# Patient Record
Sex: Male | Born: 1965 | Race: Black or African American | Hispanic: No | Marital: Married | Smoking: Current every day smoker
Health system: Southern US, Community
[De-identification: ages and names within clinical notes are randomized; demographics above are authoritative.]

## PROBLEM LIST (undated history)

## (undated) ENCOUNTER — Ambulatory Visit: Discharge status: Absent without leave | Payer: BC Managed Care – PPO

## (undated) DIAGNOSIS — K649 Unspecified hemorrhoids: Secondary | ICD-10-CM

## (undated) DIAGNOSIS — I10 Essential (primary) hypertension: Secondary | ICD-10-CM

## (undated) DIAGNOSIS — N529 Male erectile dysfunction, unspecified: Secondary | ICD-10-CM

## (undated) DIAGNOSIS — G4700 Insomnia, unspecified: Secondary | ICD-10-CM

## (undated) DIAGNOSIS — M1990 Unspecified osteoarthritis, unspecified site: Secondary | ICD-10-CM

## (undated) DIAGNOSIS — D126 Benign neoplasm of colon, unspecified: Secondary | ICD-10-CM

## (undated) DIAGNOSIS — K5790 Diverticulosis of intestine, part unspecified, without perforation or abscess without bleeding: Secondary | ICD-10-CM

## (undated) HISTORY — DX: Insomnia, unspecified: G47.00

## (undated) HISTORY — DX: Male erectile dysfunction, unspecified: N52.9

## (undated) HISTORY — DX: Unspecified osteoarthritis, unspecified site: M19.90

## (undated) HISTORY — DX: Diverticulosis of intestine, part unspecified, without perforation or abscess without bleeding: K57.90

## (undated) HISTORY — DX: Essential (primary) hypertension: I10

---

## 2012-01-02 LAB — HM DIABETES EYE EXAM: HM Diabetic Eye Exam: NEGATIVE

## 2012-03-19 ENCOUNTER — Encounter: Payer: Self-pay | Admitting: Internal Medicine

## 2012-03-19 ENCOUNTER — Encounter: Payer: Self-pay | Admitting: Gastroenterology

## 2012-03-19 ENCOUNTER — Ambulatory Visit (INDEPENDENT_AMBULATORY_CARE_PROVIDER_SITE_OTHER)
Admission: RE | Admit: 2012-03-19 | Discharge: 2012-03-19 | Disposition: A | Discharge status: Home / Self Care | Payer: BC Managed Care – PPO | Source: Ambulatory Visit | Attending: Internal Medicine | Admitting: Internal Medicine

## 2012-03-19 ENCOUNTER — Other Ambulatory Visit (INDEPENDENT_AMBULATORY_CARE_PROVIDER_SITE_OTHER): Payer: BC Managed Care – PPO

## 2012-03-19 ENCOUNTER — Ambulatory Visit (INDEPENDENT_AMBULATORY_CARE_PROVIDER_SITE_OTHER): Payer: BC Managed Care – PPO | Admitting: Internal Medicine

## 2012-03-19 VITALS — BP 130/72 | HR 73 | Temp 98.0°F | Resp 16 | Ht 71.0 in | Wt 195.0 lb

## 2012-03-19 DIAGNOSIS — K648 Other hemorrhoids: Secondary | ICD-10-CM | POA: Insufficient documentation

## 2012-03-19 DIAGNOSIS — M25519 Pain in unspecified shoulder: Secondary | ICD-10-CM

## 2012-03-19 DIAGNOSIS — M25511 Pain in right shoulder: Secondary | ICD-10-CM

## 2012-03-19 DIAGNOSIS — B351 Tinea unguium: Secondary | ICD-10-CM | POA: Insufficient documentation

## 2012-03-19 DIAGNOSIS — M19019 Primary osteoarthritis, unspecified shoulder: Secondary | ICD-10-CM | POA: Insufficient documentation

## 2012-03-19 DIAGNOSIS — Z0000 Encounter for general adult medical examination without abnormal findings: Secondary | ICD-10-CM

## 2012-03-19 DIAGNOSIS — N471 Phimosis: Secondary | ICD-10-CM | POA: Insufficient documentation

## 2012-03-19 DIAGNOSIS — N478 Other disorders of prepuce: Secondary | ICD-10-CM

## 2012-03-19 LAB — CK: Total CK: 612 U/L — ABNORMAL HIGH (ref 7–232)

## 2012-03-19 LAB — URINALYSIS, ROUTINE W REFLEX MICROSCOPIC
Bilirubin Urine: NEGATIVE
Hgb urine dipstick: NEGATIVE
Ketones, ur: NEGATIVE
Leukocytes, UA: NEGATIVE
Nitrite: NEGATIVE
Specific Gravity, Urine: 1.025 (ref 1.000–1.030)
Total Protein, Urine: NEGATIVE
Urine Glucose: NEGATIVE
Urobilinogen, UA: 0.2 (ref 0.0–1.0)
pH: 6 (ref 5.0–8.0)

## 2012-03-19 LAB — COMPREHENSIVE METABOLIC PANEL
Alkaline Phosphatase: 48 U/L (ref 39–117)
BUN: 20 mg/dL (ref 6–23)
CO2: 27 mEq/L (ref 19–32)
Creatinine, Ser: 1.3 mg/dL (ref 0.4–1.5)
GFR: 76.72 mL/min (ref >60.00)
Glucose, Bld: 108 mg/dL — ABNORMAL HIGH (ref 70–99)
Total Bilirubin: 0.7 mg/dL (ref 0.3–1.2)
Total Protein: 7.1 g/dL (ref 6.0–8.3)

## 2012-03-19 LAB — LIPID PANEL
HDL: 43.7 mg/dL (ref >39.00)
Triglycerides: 86 mg/dL (ref 0.0–149.0)
VLDL: 17.2 mg/dL (ref 0.0–40.0)

## 2012-03-19 LAB — PSA: PSA: 0.5 ng/mL (ref 0.10–4.00)

## 2012-03-19 LAB — TSH: TSH: 3.22 u[IU]/mL (ref 0.35–5.50)

## 2012-03-19 LAB — FECAL OCCULT BLOOD, GUAIAC: Fecal Occult Blood: NEGATIVE

## 2012-03-19 MED ORDER — TERBINAFINE HCL 250 MG PO TABS: 250.0000 mg | ORAL_TABLET | Freq: Every day | ORAL | Status: DC

## 2012-03-19 NOTE — Assessment & Plan Note (Signed)
Check his LFT's Start terbinafine if LFT's are normal

## 2012-03-19 NOTE — Patient Instructions (Signed)
Ringworm, Nail A fungal infection of the nail (tinea unguium/onychomycosis) is common. It is common as the visible part of the nail is composed of dead cells which have no blood supply to help prevent infection. It occurs because fungi are everywhere and will pick any opportunity to grow on any dead material. Because nails are very slow growing they require up to 2 years of treatment with anti-fungal medications. The entire nail back to the base is infected. This includes approximately  of the nail which you cannot see. If your caregiver has prescribed a medication by mouth, take it every day and as directed. No progress will be seen for at least 6 to 9 months. Do not be disappointed! Because fungi live on dead cells with little or no exposure to blood supply, medication delivery to the infection is slow; thus the cure is slow. It is also why you can observe no progress in the first 6 months. The nail becoming cured is the base of the nail, as it has the blood supply. Topical medication such as creams and ointments are usually not effective. Important in successful treatment of nail fungus is closely following the medication regimen that your doctor prescribes. Sometimes you and your caregiver may elect to speed up this process by surgical removal of all the nails. Even this may still require 6 to 9 months of additional oral medications. See your caregiver as directed. Remember there will be no visible improvement for at least 6 months. See your caregiver sooner if other signs of infection (redness and swelling) develop. Document Released: 03/04/2000 Document Revised: 05/30/2011 Document Reviewed: 05/13/2008 [facility] Patient Information 2013 [place], [place]. Health Maintenance, Males A healthy lifestyle and preventative care can promote health and wellness.  Maintain regular health, dental, and eye exams.  Eat a healthy diet. Foods like vegetables, fruits, whole grains, low-fat dairy products, and lean  protein foods contain the nutrients you need without too many calories. Decrease your intake of foods high in solid fats, added sugars, and salt. Get information about a proper diet from your caregiver, if necessary.  Regular physical exercise is one of the most important things you can do for your health. Most adults should get at least 150 minutes of moderate-intensity exercise (any activity that increases your heart rate and causes you to sweat) each week. In addition, most adults need muscle-strengthening exercises on 2 or more days a week.   Maintain a healthy weight. The body mass index (BMI) is a screening tool to identify possible weight problems. It provides an estimate of body fat based on height and weight. Your caregiver can help determine your BMI, and can help you achieve or maintain a healthy weight. For adults 20 years and older:  A BMI below 18.5 is considered underweight.  A BMI of 18.5 to 24.9 is normal.  A BMI of 25 to 29.9 is considered overweight.  A BMI of 30 and above is considered obese.  Maintain normal blood lipids and cholesterol by exercising and minimizing your intake of saturated fat. Eat a balanced diet with plenty of fruits and vegetables. Blood tests for lipids and cholesterol should begin at age 58 and be repeated every 5 years. If your lipid or cholesterol levels are high, you are over 50, or you are a high risk for heart disease, you may need your cholesterol levels checked more frequently.Ongoing high lipid and cholesterol levels should be treated with medicines, if diet and exercise are not effective.  If you smoke, find  out from your caregiver how to quit. If you do not use tobacco, do not start.  If you choose to drink alcohol, do not exceed 2 drinks per day. One drink is considered to be 12 ounces (355 mL) of beer, 5 ounces (148 mL) of wine, or 1.5 ounces (44 mL) of liquor.  Avoid use of street drugs. Do not share needles with anyone. Ask for help if  you need support or instructions about stopping the use of drugs.  High blood pressure causes heart disease and increases the risk of stroke. Blood pressure should be checked at least every 1 to 2 years. Ongoing high blood pressure should be treated with medicines if weight loss and exercise are not effective.  If you are 35 to 47 years old, ask your caregiver if you should take aspirin to prevent heart disease.  Diabetes screening involves taking a blood sample to check your fasting blood sugar level. This should be done once every 3 years, after age 107, if you are within normal weight and without risk factors for diabetes. Testing should be considered at a younger age or be carried out more frequently if you are overweight and have at least 1 risk factor for diabetes.  Colorectal cancer can be detected and often prevented. Most routine colorectal cancer screening begins at the age of 53 and continues through age 40. However, your caregiver may recommend screening at an earlier age if you have risk factors for colon cancer. On a yearly basis, your caregiver may provide home test kits to check for hidden blood in the stool. Use of a small camera at the end of a tube, to directly examine the colon (sigmoidoscopy or colonoscopy), can detect the earliest forms of colorectal cancer. Talk to your caregiver about this at age 32, when routine screening begins. Direct examination of the colon should be repeated every 5 to 10 years through age 2, unless early forms of pre-cancerous polyps or small growths are found.  Hepatitis C blood testing is recommended for all people born from [birth year] through 1966 and any individual with known risks for hepatitis C.  Healthy men should no longer receive prostate-specific antigen (PSA) blood tests as part of routine cancer screening. Consult with your caregiver about prostate cancer screening.  Testicular cancer screening is not recommended for adolescents or adult males who  have no symptoms. Screening includes self-exam, caregiver exam, and other screening tests. Consult with your caregiver about any symptoms you have or any concerns you have about testicular cancer.  Practice safe sex. Use condoms and avoid high-risk sexual practices to reduce the spread of sexually transmitted infections (STIs).  Use sunscreen with a sun protection factor (SPF) of 30 or greater. Apply sunscreen liberally and repeatedly throughout the day. You should seek shade when your shadow is shorter than you. Protect yourself by wearing long sleeves, pants, a wide-brimmed hat, and sunglasses year round, whenever you are outdoors.  Notify your caregiver of new moles or changes in moles, especially if there is a change in shape or color. Also notify your caregiver if a mole is larger than the size of a pencil eraser.  A one-time screening for abdominal aortic aneurysm (AAA) and surgical repair of large AAAs by sound wave imaging (ultrasonography) is recommended for ages 31 to 9 years who are current or former smokers.  Stay current with your immunizations. Document Released: 09/03/2007 Document Revised: 05/30/2011 Document Reviewed: 08/02/2010 [facility] Patient Information 2013 [place], [place].

## 2012-03-19 NOTE — Assessment & Plan Note (Signed)
He wants to have a circumcision done ---> urology referral

## 2012-03-19 NOTE — Assessment & Plan Note (Signed)
GI referral - he wants these treated

## 2012-03-19 NOTE — Assessment & Plan Note (Signed)
Exam done Vaccines were reviewed Labs ordered Pt ed material was given 

## 2012-03-19 NOTE — Progress Notes (Signed)
Subjective:    Patient ID: Dakota Jackson, male    DOB: 14-Jun-1965, 46 y.o.   MRN: [id number]  Arthritis Presents for initial visit. The disease course has been fluctuating. The condition has lasted for 4 months. He complains of pain. He reports no stiffness, joint swelling or joint warmth. Affected locations include the left shoulder and right shoulder. His pain is at a severity of 2/10. Associated symptoms include pain at night and pain while resting. Pertinent negatives include no diarrhea, dry eyes, dry mouth, dysuria, fatigue, fever, rash, Raynaud's syndrome, uveitis or weight loss. His pertinent risk factors include overuse. Past treatments include NSAIDs. The treatment provided moderate relief. Factors aggravating his arthritis include activity. Compliance with prior treatments has been variable.      Review of Systems  Constitutional: Negative.  Negative for fever, weight loss and fatigue.  HENT: Negative.   Eyes: Negative.   Respiratory: Negative.   Cardiovascular: Negative for chest pain, palpitations and leg swelling.  Gastrointestinal: Positive for anal bleeding and rectal pain. Negative for nausea, vomiting, abdominal pain, diarrhea, constipation, blood in stool and abdominal distention.  Genitourinary: Positive for penile pain (foreskin is uncomfortable). Negative for dysuria, urgency, frequency, hematuria, flank pain, decreased urine volume, discharge, penile swelling, scrotal swelling, enuresis, difficulty urinating, genital sores and testicular pain.  Musculoskeletal: Positive for myalgias (around both shoulders), arthralgias (shoulders) and arthritis. Negative for back pain, joint swelling, gait problem and stiffness.  Skin: Positive for color change (toenails are thick and dark). Negative for pallor, rash and wound.  Hematological: Negative for adenopathy. Does not bruise/bleed easily.  Psychiatric/Behavioral: Negative.        Objective:   Physical Exam  Vitals  reviewed. Constitutional: He is oriented to person, place, and time. He appears well-developed and well-nourished. No distress.  HENT:  Head: Normocephalic and atraumatic.  Mouth/Throat: Oropharynx is clear and moist. No oropharyngeal exudate.  Eyes: Conjunctivae normal are normal. Right eye exhibits no discharge. Left eye exhibits no discharge. No scleral icterus.  Neck: Normal range of motion. Neck supple. No JVD present. No tracheal deviation present. No thyromegaly present.  Cardiovascular: Normal rate, regular rhythm, normal heart sounds and intact distal pulses.  Exam reveals no gallop and no friction rub.   No murmur heard. Pulmonary/Chest: Effort normal and breath sounds normal. No stridor. No respiratory distress. He has no wheezes. He has no rales. He exhibits no tenderness.  Abdominal: Soft. Bowel sounds are normal. He exhibits no distension and no mass. There is no tenderness. There is no rebound and no guarding. Hernia confirmed negative in the right inguinal area and confirmed negative in the left inguinal area.  Genitourinary: Prostate normal, testes normal and penis normal. Rectal exam shows external hemorrhoid and internal hemorrhoid. Rectal exam shows no fissure, no mass, no tenderness and anal tone normal. Guaiac negative stool. Prostate is not enlarged and not tender. Right testis shows no mass, no swelling and no tenderness. Right testis is descended. Left testis shows no mass, no swelling and no tenderness. Left testis is descended. Uncircumcised. No phimosis, paraphimosis, hypospadias, penile erythema or penile tenderness. No discharge found.  Musculoskeletal: Normal range of motion. He exhibits no edema and no tenderness.       Right shoulder: Normal. He exhibits normal range of motion, no tenderness, no bony tenderness, no swelling, no effusion, no crepitus, no deformity, no laceration, no pain, no spasm, normal pulse and normal strength.       Left shoulder: Normal. He  exhibits normal range  of motion, no tenderness, no bony tenderness, no swelling, no effusion, no crepitus, no deformity, no laceration, no pain, no spasm, normal pulse and normal strength.  Lymphadenopathy:    He has no cervical adenopathy.       Right: No inguinal adenopathy present.       Left: No inguinal adenopathy present.  Neurological: He is alert and oriented to person, place, and time. He displays normal reflexes. No cranial nerve deficit. He exhibits normal muscle tone. Coordination normal.  Skin: Skin is warm and dry. No rash noted. He is not diaphoretic. No erythema. No pallor.       50% of toenails show dark thickening with lysis and subungual debris  Psychiatric: He has a normal mood and affect. His behavior is normal. Judgment and thought content normal.      No results found for this basename: WBC, HGB, HCT, PLT, GLUCOSE, CHOL, TRIG, HDL, LDLDIRECT, LDLCALC, ALT, AST, NA, K, CL, CREATININE, BUN, CO2, TSH, PSA, INR, GLUF, HGBA1C, MICROALBUR      Assessment & Plan:

## 2012-03-19 NOTE — Assessment & Plan Note (Signed)
He will continue taking nsaids for pain I will check plain films of his shoulders today to look for djd,spurs I will check his CPK level to see if he has a myopathy

## 2012-03-20 ENCOUNTER — Encounter: Payer: Self-pay | Admitting: Internal Medicine

## 2012-03-20 LAB — CBC WITH DIFFERENTIAL/PLATELET
Basophils Relative: 1.5 % (ref 0.0–3.0)
Eosinophils Relative: 3.1 % (ref 0.0–5.0)
HCT: 44.2 % (ref 39.0–52.0)
Hemoglobin: 15.1 g/dL (ref 13.0–17.0)
Lymphs Abs: 2.1 10*3/uL (ref 0.7–4.0)
Monocytes Relative: 12.3 % — ABNORMAL HIGH (ref 3.0–12.0)
Platelets: 274 10*3/uL (ref 150.0–400.0)
RBC: 5.1 Mil/uL (ref 4.22–5.81)
WBC: 5.1 10*3/uL (ref 4.5–10.5)

## 2012-03-21 DIAGNOSIS — D126 Benign neoplasm of colon, unspecified: Secondary | ICD-10-CM

## 2012-03-21 HISTORY — DX: Benign neoplasm of colon, unspecified: D12.6

## 2012-03-23 ENCOUNTER — Telehealth: Payer: Self-pay | Admitting: Internal Medicine

## 2012-03-23 NOTE — Telephone Encounter (Signed)
Patient is calling to get the results from his x-ray

## 2012-03-23 NOTE — Telephone Encounter (Signed)
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

## 2012-03-28 NOTE — Telephone Encounter (Signed)
Called patient// no answer and letter mailed out again

## 2012-04-02 ENCOUNTER — Ambulatory Visit: Payer: BC Managed Care – PPO | Admitting: Gastroenterology

## 2012-04-09 ENCOUNTER — Encounter: Payer: Self-pay | Admitting: Gastroenterology

## 2012-04-09 ENCOUNTER — Ambulatory Visit (INDEPENDENT_AMBULATORY_CARE_PROVIDER_SITE_OTHER): Payer: BC Managed Care – PPO | Admitting: Gastroenterology

## 2012-04-09 VITALS — BP 102/52 | HR 76 | Ht 69.75 in | Wt 198.5 lb

## 2012-04-09 DIAGNOSIS — K921 Melena: Secondary | ICD-10-CM

## 2012-04-09 DIAGNOSIS — K5900 Constipation, unspecified: Secondary | ICD-10-CM

## 2012-04-09 MED ORDER — PEG-KCL-NACL-NASULF-NA ASC-C 100 G PO SOLR: 1.0000 | Freq: Once | ORAL | Status: DC

## 2012-04-09 NOTE — Patient Instructions (Addendum)
You have been scheduled for a colonoscopy with propofol. Please follow written instructions given to you at your visit today.  Please pick up your prep kit at the pharmacy within the next 1-3 days. If you use inhalers (even only as needed) or a CPAP machine, please bring them with you on the day of your procedure.  High-Fiber Diet Fiber is found in fruits, vegetables, and grains. A high-fiber diet encourages the addition of more whole grains, legumes, fruits, and vegetables in your diet. The recommended amount of fiber for adult males is 38 g per day. For adult females, it is 25 g per day. Pregnant and lactating women should get 28 g of fiber per day. If you have a digestive or bowel problem, ask your caregiver for advice before adding high-fiber foods to your diet. Eat a variety of high-fiber foods instead of only a select few type of foods.  PURPOSE  To increase stool bulk.  To make bowel movements more regular to prevent constipation.  To lower cholesterol.  To prevent overeating. WHEN IS THIS DIET USED?  It may be used if you have constipation and hemorrhoids.  It may be used if you have uncomplicated diverticulosis (intestine condition) and irritable bowel syndrome.  It may be used if you need help with weight management.  It may be used if you want to add it to your diet as a protective measure against atherosclerosis, diabetes, and cancer. SOURCES OF FIBER  Whole-grain breads and cereals.  Fruits, such as apples, oranges, bananas, berries, prunes, and pears.  Vegetables, such as green peas, carrots, sweet potatoes, beets, broccoli, cabbage, spinach, and artichokes.  Legumes, such split peas, soy, lentils.  Almonds. FIBER CONTENT IN FOODS Starches and Grains / Dietary Fiber (g)  Cheerios, 1 cup / 3 g  Corn Flakes cereal, 1 cup / 0.7 g  Rice crispy treat cereal, 1 cup / 0.3 g  Instant oatmeal (cooked),  cup / 2 g  Frosted wheat cereal, 1 cup / 5.1 g  Brown,  long-grain rice (cooked), 1 cup / 3.5 g  White, long-grain rice (cooked), 1 cup / 0.6 g  Enriched macaroni (cooked), 1 cup / 2.5 g Legumes / Dietary Fiber (g)  Baked beans (canned, plain, or vegetarian),  cup / 5.2 g  Kidney beans (canned),  cup / 6.8 g  Pinto beans (cooked),  cup / 5.5 g Breads and Crackers / Dietary Fiber (g)  Plain or honey graham crackers, 2 squares / 0.7 g  Saltine crackers, 3 squares / 0.3 g  Plain, salted pretzels, 10 pieces / 1.8 g  Whole-wheat bread, 1 slice / 1.9 g  White bread, 1 slice / 0.7 g  Raisin bread, 1 slice / 1.2 g  Plain bagel, 3 oz / 2 g  Flour tortilla, 1 oz / 0.9 g  Corn tortilla, 1 small / 1.5 g  Hamburger or hotdog bun, 1 small / 0.9 g Fruits / Dietary Fiber (g)  Apple with skin, 1 medium / 4.4 g  Sweetened applesauce,  cup / 1.5 g  Banana,  medium / 1.5 g  Grapes, 10 grapes / 0.4 g  Orange, 1 small / 2.3 g  Raisin, 1.5 oz / 1.6 g  Melon, 1 cup / 1.4 g Vegetables / Dietary Fiber (g)  Green beans (canned),  cup / 1.3 g  Carrots (cooked),  cup / 2.3 g  Broccoli (cooked),  cup / 2.8 g  Peas (cooked),  cup / 4.4 g  Mashed  potatoes,  cup / 1.6 g  Lettuce, 1 cup / 0.5 g  Corn (canned),  cup / 1.6 g  Tomato,  cup / 1.1 g Document Released: 03/07/2005 Document Revised: 09/06/2011 Document Reviewed: 06/09/2011 [facility] Patient Information 2013 [place], [place].   Constipation, Adult Constipation is when a person has fewer than 3 bowel movements a week; has difficulty having a bowel movement; or has stools that are dry, hard, or larger than normal. As people grow older, constipation is more common. If you try to fix constipation with medicines that make you have a bowel movement (laxatives), the problem may get worse. Long-term laxative use may cause the muscles of the colon to become weak. A low-fiber diet, not taking in enough fluids, and taking certain medicines may make constipation worse. CAUSES     Certain medicines, such as antidepressants, pain medicine, iron supplements, antacids, and water pills.   Certain diseases, such as diabetes, irritable bowel syndrome (IBS), thyroid disease, or depression.   Not drinking enough water.   Not eating enough fiber-rich foods.   Stress or travel.  Lack of physical activity or exercise.  Not going to the restroom when there is the urge to have a bowel movement.  Ignoring the urge to have a bowel movement.  Using laxatives too much. SYMPTOMS   Having fewer than 3 bowel movements a week.   Straining to have a bowel movement.   Having hard, dry, or larger than normal stools.   Feeling full or bloated.   Pain in the lower abdomen.  Not feeling relief after having a bowel movement. DIAGNOSIS  Your caregiver will take a medical history and perform a physical exam. Further testing may be done for severe constipation. Some tests may include:   A barium enema X-ray to examine your rectum, colon, and sometimes, your small intestine.  A sigmoidoscopy to examine your lower colon.  A colonoscopy to examine your entire colon. TREATMENT  Treatment will depend on the severity of your constipation and what is causing it. Some dietary treatments include drinking more fluids and eating more fiber-rich foods. Lifestyle treatments may include regular exercise. If these diet and lifestyle recommendations do not help, your caregiver may recommend taking over-the-counter laxative medicines to help you have bowel movements. Prescription medicines may be prescribed if over-the-counter medicines do not work.  HOME CARE INSTRUCTIONS   Increase dietary fiber in your diet, such as fruits, vegetables, whole grains, and beans. Limit high-fat and processed sugars in your diet, such as [place] fries, hamburgers, cookies, candies, and soda.   A fiber supplement may be added to your diet if you cannot get enough fiber from foods.   Drink enough  fluids to keep your urine clear or pale yellow.   Exercise regularly or as directed by your caregiver.   Go to the restroom when you have the urge to go. Do not hold it.  Only take medicines as directed by your caregiver. Do not take other medicines for constipation without talking to your caregiver first. SEEK IMMEDIATE MEDICAL CARE IF:   You have bright red blood in your stool.   Your constipation lasts for more than 4 days or gets worse.   You have abdominal or rectal pain.   You have thin, pencil-like stools.  You have unexplained weight loss. MAKE SURE YOU:   Understand these instructions.  Will watch your condition.  Will get help right away if you are not doing well or get worse. Document Released: 12/04/2003 Document  Revised: 05/30/2011 Document Reviewed: 02/08/2011 [facility] Patient Information 2013 [place], [place].

## 2012-04-09 NOTE — Progress Notes (Signed)
History of Present Illness: This is a 47 year old male with intermittent, small volume bright red blood per rectum and mild rectal pain generally associated with bowel movements. He relates that his symptoms have been present for 8 or 9 years. He states that while he was incarcerated he was sent to [facility] for colonoscopy in 2007 or  2008 that showed only diverticulosis. He's been using Preparation H suppositories as needed. He complains of mild intermittent constipation. Denies weight loss, abdominal pain, diarrhea, change in stool caliber, melena, nausea, vomiting, dysphagia, reflux symptoms, chest pain.  Review of Systems: Pertinent positive and negative review of systems were noted in the above HPI section. All other review of systems were otherwise negative.  Current Medications, Allergies, Past Medical History, Past Surgical History, Family History and Social History were reviewed in [employer] record.  Physical Exam: General: Well developed , well nourished, no acute distress Head: Normocephalic and atraumatic Eyes:  sclerae anicteric, EOMI Ears: Normal auditory acuity Mouth: No deformity or lesions Neck: Supple, no masses or thyromegaly Lungs: Clear throughout to auscultation Heart: Regular rate and rhythm; no murmurs, rubs or bruits Abdomen: Soft, non tender and non distended. No masses, hepatosplenomegaly or hernias noted. Normal Bowel sounds Rectal: recent exam by Dr. Yetta Barre was negative, deferred to colonosocpy Musculoskeletal: Symmetrical with no gross deformities  Skin: No lesions on visible extremities Pulses:  Normal pulses noted Extremities: No clubbing, cyanosis, edema or deformities noted Neurological: Alert oriented x 4, grossly nonfocal Cervical Nodes:  No significant cervical adenopathy Inguinal Nodes: No significant inguinal adenopathy Psychological:  Alert and cooperative. Normal mood and affect  Assessment and Recommendations:  1.  Hematochezia, rectal pain, constipation. Prior colonoscopy in 2007 or 2008 showed diverticulosis by the patient's report. Rule out hemorrhoids, fissures, proctitis and other disorders. Schedule colonoscopy. Increase dietary fiber and water intake. The risks, benefits, and alternatives to colonoscopy with possible biopsy and possible polypectomy were discussed with the patient and they consent to proceed.

## 2012-04-10 ENCOUNTER — Encounter: Payer: Self-pay | Admitting: Gastroenterology

## 2012-04-10 ENCOUNTER — Encounter: Payer: Self-pay | Admitting: *Deleted

## 2012-04-10 ENCOUNTER — Ambulatory Visit (AMBULATORY_SURGERY_CENTER): Payer: BC Managed Care – PPO | Admitting: Gastroenterology

## 2012-04-10 VITALS — BP 117/61 | HR 65 | Temp 97.5°F | Resp 15 | Ht 71.0 in | Wt 195.0 lb

## 2012-04-10 DIAGNOSIS — K5900 Constipation, unspecified: Secondary | ICD-10-CM

## 2012-04-10 DIAGNOSIS — K921 Melena: Secondary | ICD-10-CM

## 2012-04-10 DIAGNOSIS — D126 Benign neoplasm of colon, unspecified: Secondary | ICD-10-CM

## 2012-04-10 DIAGNOSIS — K648 Other hemorrhoids: Secondary | ICD-10-CM

## 2012-04-10 MED ORDER — SODIUM CHLORIDE 0.9 % IV SOLN: 500.0000 mL | INTRAVENOUS | Status: DC

## 2012-04-10 NOTE — Op Note (Signed)
[facility] [address] [place], [address]   COLONOSCOPY PROCEDURE REPORT  PATIENT: Dakota Jackson, Dakota Jackson.  MR#: [id number] BIRTHDATE: 1965/06/28 , 46  yrs. old GENDER: Male ENDOSCOPIST: Meryl Dare, MD, [facility] REFERRED IO:NGEXBM Karsten Ro, M.D. PROCEDURE DATE:  04/10/2012 PROCEDURE:   Colonoscopy with biopsy and injection sclerosis of internal hemorrhoids ASA CLASS:   Class II INDICATIONS:hematochezia and constipation. MEDICATIONS: MAC sedation, administered by CRNA and propofol (Diprivan) 350mg  IV DESCRIPTION OF PROCEDURE:   After the risks benefits and alternatives of the procedure were thoroughly explained, informed consent was obtained.  A digital rectal exam revealed no abnormalities of the rectum.   The LB [id number]  endoscope was introduced through the anus and advanced to the cecum, which was identified by both the appendix and ileocecal valve. No adverse events experienced.   The quality of the prep was good, using MoviPrep  The instrument was then slowly withdrawn as the colon was fully examined.  COLON FINDINGS: Two sessile polyps measuring 3-4 mm in size were found at the cecum.  A polypectomy was performed with cold forceps. The resection was complete and the polyp tissue was completely retrieved.   A sessile polyp measuring 5 mm in size was found in the rectum.  A polypectomy was performed with cold forceps.  The resection was complete and the polyp tissue was completely retrieved.   The colon was otherwise normal.  There was no diverticulosis, inflammation, polyps or cancers unless previously stated.  Retroflexed views revealed moderate internal hemorrhoids. 2.5 cc of 23.4% saline injected into the hemorrhoids well above the dentate line. The time to cecum=1 minutes 30 seconds.  Withdrawal time=11 minutes 12 seconds.  The scope was withdrawn and the procedure completed. COMPLICATIONS: There were no complications.  ENDOSCOPIC  IMPRESSION: 1.   Two sessile polyps measuring 3-4 mm at the cecum; polypectomy performed with cold forceps 2.   Sessile polyp measuring 5 mm in the rectum; polypectomy performed with cold forceps 3.   Moderate internal hemorrhoids  RECOMMENDATIONS: 1.  Await pathology results 2.  Repeat colonoscopy in 5 years if polyp adenomatous; otherwise 10 years 3.  High fiber diet with liberal fluid intake.  eSigned:  Meryl Dare, MD, [facility] 04/10/2012 3:57 PM

## 2012-04-10 NOTE — Patient Instructions (Addendum)
Discharge instructions given with verbal understanding. Handout on polyps given. Resume previous medications. YOU HAD AN ENDOSCOPIC PROCEDURE TODAY AT THE [facility]: Refer to the procedure report that was given to you for any specific questions about what was found during the examination.  If the procedure report does not answer your questions, please call your gastroenterologist to clarify.  If you requested that your care partner not be given the details of your procedure findings, then the procedure report has been included in a sealed envelope for you to review at your convenience later.  YOU SHOULD EXPECT: Some feelings of bloating in the abdomen. Passage of more gas than usual.  Walking can help get rid of the air that was put into your GI tract during the procedure and reduce the bloating. If you had a lower endoscopy (such as a colonoscopy or flexible sigmoidoscopy) you may notice spotting of blood in your stool or on the toilet paper. If you underwent a bowel prep for your procedure, then you may not have a normal bowel movement for a few days.  DIET: Your first meal following the procedure should be a light meal and then it is ok to progress to your normal diet.  A half-sandwich or bowl of soup is an example of a good first meal.  Heavy or fried foods are harder to digest and may make you feel nauseous or bloated.  Likewise meals heavy in dairy and vegetables can cause extra gas to form and this can also increase the bloating.  Drink plenty of fluids but you should avoid alcoholic beverages for 24 hours.  ACTIVITY: Your care partner should take you home directly after the procedure.  You should plan to take it easy, moving slowly for the rest of the day.  You can resume normal activity the day after the procedure however you should NOT DRIVE or use heavy machinery for 24 hours (because of the sedation medicines used during the test).    SYMPTOMS TO REPORT IMMEDIATELY: A  gastroenterologist can be reached at any hour.  During normal business hours, 8:30 AM to 5:00 PM Monday through Friday, call [phone].  After hours and on weekends, please call the GI answering service at [phone] who will take a message and have the physician on call contact you.   Following lower endoscopy (colonoscopy or flexible sigmoidoscopy):  Excessive amounts of blood in the stool  Significant tenderness or worsening of abdominal pains  Swelling of the abdomen that is new, acute  Fever of 100F or higher  FOLLOW UP: If any biopsies were taken you will be contacted by phone or by letter within the next 1-3 weeks.  Call your gastroenterologist if you have not heard about the biopsies in 3 weeks.  Our staff will call the home number listed on your records the next business day following your procedure to check on you and address any questions or concerns that you may have at that time regarding the information given to you following your procedure. This is a courtesy call and so if there is no answer at the home number and we have not heard from you through the emergency physician on call, we will assume that you have returned to your regular daily activities without incident.  SIGNATURES/CONFIDENTIALITY: You and/or your care partner have signed paperwork which will be entered into your electronic medical record.  These signatures attest to the fact that that the information above on your After Visit Summary has   been reviewed and is understood.  Full responsibility of the confidentiality of this discharge information lies with you and/or your care-partner. 

## 2012-04-10 NOTE — Progress Notes (Signed)
Lidocaine-40mg IV prior to Propofol InductionPropofol given over incremental dosages 

## 2012-04-10 NOTE — Progress Notes (Signed)
Called to room to assist during endoscopic procedure.  Patient ID and intended procedure confirmed with present staff. Received instructions for my participation in the procedure from the performing physician.  

## 2012-04-10 NOTE — Progress Notes (Signed)
Patient did not experience any of the following events: a burn prior to discharge; a fall within the facility; wrong site/side/patient/procedure/implant event; or a hospital transfer or hospital admission upon discharge from the facility. (G8907) Patient did not have preoperative order for IV antibiotic SSI prophylaxis. (G8918)  

## 2012-04-11 ENCOUNTER — Telehealth: Payer: Self-pay

## 2012-04-11 NOTE — Telephone Encounter (Signed)
  Follow up Call-  Call back number 04/10/2012  Post procedure Call Back phone  # [phone]  Permission to leave phone message Yes     Patient questions:  Do you have a fever, pain , or abdominal swelling? no Pain Score  0 *  Have you tolerated food without any problems? yes  Have you been able to return to your normal activities? yes  Do you have any questions about your discharge instructions: Diet   no Medications  no Follow up visit  no  Do you have questions or concerns about your Care? no  Actions: * If pain score is 4 or above: No action needed, pain <4.

## 2012-04-13 ENCOUNTER — Other Ambulatory Visit: Payer: Self-pay | Admitting: Urology

## 2012-04-16 ENCOUNTER — Ambulatory Visit: Payer: BC Managed Care – PPO | Admitting: Internal Medicine

## 2012-04-16 ENCOUNTER — Encounter: Payer: Self-pay | Admitting: Gastroenterology

## 2012-04-16 ENCOUNTER — Encounter (HOSPITAL_BASED_OUTPATIENT_CLINIC_OR_DEPARTMENT_OTHER): Payer: Self-pay | Admitting: *Deleted

## 2012-04-16 DIAGNOSIS — Z0289 Encounter for other administrative examinations: Secondary | ICD-10-CM

## 2012-04-16 NOTE — Progress Notes (Signed)
Pt instructed npo p mn tonight.  To [facility] 1/28 @ 0645. Needs hgb , ?ekg.

## 2012-04-16 NOTE — H&P (Signed)
History of Present Illness     Dakota Jackson is referred by Dr Sanda Linger for circumcision consultation.  He has been having breakdowns of his foreskin for several years.  It is difficult to retract the foreskin and hygiene is an issue.  He would like to have a circumcision.  He has difficulty with erections.  He has used Viagra in the past with excellent results.  He would like to try Cialis.   Past Medical History Problems  1. History of  Hypertension 401.9  Current Meds 1. LamISIL 250 MG Oral Tablet; Therapy: (Recorded:22Jan2014) to  Allergies Medication  1. No Known Drug Allergies  Family History Problems  1. Maternal history of  Family Health Status Number Of Children 2. Family history of  Lung Cancer V16.1 3. Family history of  Mother Deceased At Age 6  Social History Problems    Marital History - Currently Married   Occupation:   Tobacco Use 305.1 Denied    History of  Alcohol Use   History of  Caffeine Use  Review of Systems Genitourinary, constitutional, skin, eye, otolaryngeal, hematologic/lymphatic, cardiovascular, pulmonary, endocrine, musculoskeletal, gastrointestinal, neurological and psychiatric system(s) were reviewed and pertinent findings if present are noted.  Musculoskeletal: joint pain.    Vitals Vital Signs [Data Includes: Last 1 Day]  22Jan2014 11:47AM  BMI Calculated: 28.06 BSA Calculated: 2.07 Height: 5 ft 10 in Weight: 196 lb  Blood Pressure: 129 / 81 Heart Rate: 70 Respiration: 18  Physical Exam Constitutional: Well nourished and well developed . No acute distress.  ENT:. The ears and nose are normal in appearance.  Neck: The appearance of the neck is normal and no neck mass is present.  Pulmonary: No respiratory distress and normal respiratory rhythm and effort.  Cardiovascular: Heart rate and rhythm are normal . No peripheral edema.  Abdomen: The abdomen is soft and nontender. No masses are palpated. No CVA tenderness. No hernias are  palpable. No hepatosplenomegaly noted.  Rectal: The prostate exam was refused by the patient.  Genitourinary: Examination of the penis demonstrates no discharge, no masses, no lesions and a normal meatus. The scrotum is without lesions. The right epididymis is palpably normal and non-tender. The left epididymis is palpably normal and non-tender. The right testis is non-tender and without masses. The left testis is non-tender and without masses.  Lymphatics: The femoral and inguinal nodes are not enlarged or tender.  Skin: Normal skin turgor, no visible rash and no visible skin lesions.  Neuro/Psych:. Mood and affect are appropriate.    Results/Data Urine [Data Includes: Last 1 Day]   22Jan2014  COLOR YELLOW   APPEARANCE CLEAR   SPECIFIC GRAVITY 1.015   pH 6.0   GLUCOSE NEG mg/dL  BILIRUBIN NEG   KETONE NEG mg/dL  BLOOD NEG   PROTEIN NEG mg/dL  UROBILINOGEN 0.2 mg/dL  NITRITE NEG   LEUKOCYTE ESTERASE NEG    Assessment Assessed  1. Phimosis 605 2. 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 UA With REFLEX  Done: 22Jan2014 11:16AM   Circumcision.  The procedure, risks, benefits were reviewed with the patient.  The risks include but are not limited to hemorrhage, infection, hematoma, skin loss.  He understands and wishes to proceed.  Cialis 20 mgm PRN for erectile dysfunction.   Signatures  CC: Dr Sanda Linger  Electronically signed by : Su Grand, M.D.; Apr 11 2012 12:19PM

## 2012-04-17 ENCOUNTER — Encounter (HOSPITAL_BASED_OUTPATIENT_CLINIC_OR_DEPARTMENT_OTHER): Payer: Self-pay | Admitting: *Deleted

## 2012-04-17 ENCOUNTER — Encounter (HOSPITAL_BASED_OUTPATIENT_CLINIC_OR_DEPARTMENT_OTHER): Payer: Self-pay | Admitting: Anesthesiology

## 2012-04-17 ENCOUNTER — Ambulatory Visit (HOSPITAL_BASED_OUTPATIENT_CLINIC_OR_DEPARTMENT_OTHER): Payer: BC Managed Care – PPO | Admitting: Anesthesiology

## 2012-04-17 ENCOUNTER — Encounter (HOSPITAL_BASED_OUTPATIENT_CLINIC_OR_DEPARTMENT_OTHER)
Admission: RE | Disposition: A | Discharge status: Home / Self Care | Payer: Self-pay | Source: Ambulatory Visit | Attending: Urology

## 2012-04-17 ENCOUNTER — Ambulatory Visit (HOSPITAL_BASED_OUTPATIENT_CLINIC_OR_DEPARTMENT_OTHER)
Admission: RE | Admit: 2012-04-17 | Discharge: 2012-04-17 | Disposition: A | Discharge status: Home / Self Care | Payer: BC Managed Care – PPO | Source: Ambulatory Visit | Attending: Urology | Admitting: Urology

## 2012-04-17 DIAGNOSIS — I10 Essential (primary) hypertension: Secondary | ICD-10-CM | POA: Insufficient documentation

## 2012-04-17 DIAGNOSIS — Z79899 Other long term (current) drug therapy: Secondary | ICD-10-CM | POA: Insufficient documentation

## 2012-04-17 DIAGNOSIS — N471 Phimosis: Secondary | ICD-10-CM | POA: Insufficient documentation

## 2012-04-17 DIAGNOSIS — N478 Other disorders of prepuce: Secondary | ICD-10-CM | POA: Insufficient documentation

## 2012-04-17 DIAGNOSIS — N529 Male erectile dysfunction, unspecified: Secondary | ICD-10-CM | POA: Insufficient documentation

## 2012-04-17 HISTORY — DX: Unspecified hemorrhoids: K64.9

## 2012-04-17 HISTORY — PX: CIRCUMCISION: SHX1350

## 2012-04-17 SURGERY — CIRCUMCISION, ADULT
Anesthesia: General | Site: Penis | Wound class: Clean

## 2012-04-17 MED ORDER — HYDROCODONE-ACETAMINOPHEN 5-500 MG PO CAPS: 1.0000 | ORAL_CAPSULE | Freq: Four times a day (QID) | ORAL | Status: DC | PRN

## 2012-04-17 MED ORDER — PROPOFOL 10 MG/ML IV BOLUS
INTRAVENOUS | Status: DC | PRN
  Administered 2012-04-17: 200 mg via INTRAVENOUS

## 2012-04-17 MED ORDER — FENTANYL CITRATE 0.05 MG/ML IJ SOLN
25.0000 ug | INTRAMUSCULAR | Status: DC | PRN
  Filled 2012-04-17: qty 1

## 2012-04-17 MED ORDER — OXYCODONE HCL 5 MG PO TABS
5.0000 mg | ORAL_TABLET | Freq: Once | ORAL | Status: AC
  Administered 2012-04-17: 5 mg via ORAL
  Filled 2012-04-17: qty 1

## 2012-04-17 MED ORDER — PROMETHAZINE HCL 25 MG/ML IJ SOLN
6.2500 mg | INTRAMUSCULAR | Status: DC | PRN
  Filled 2012-04-17: qty 1

## 2012-04-17 MED ORDER — FENTANYL CITRATE 0.05 MG/ML IJ SOLN
INTRAMUSCULAR | Status: DC | PRN
  Administered 2012-04-17 (×2): 50 ug via INTRAVENOUS

## 2012-04-17 MED ORDER — BUPIVACAINE HCL (PF) 0.25 % IJ SOLN
INTRAMUSCULAR | Status: DC | PRN
  Administered 2012-04-17: 10 mL

## 2012-04-17 MED ORDER — DEXAMETHASONE SODIUM PHOSPHATE 4 MG/ML IJ SOLN
INTRAMUSCULAR | Status: DC | PRN
  Administered 2012-04-17: 10 mg via INTRAVENOUS

## 2012-04-17 MED ORDER — KETOROLAC TROMETHAMINE 30 MG/ML IJ SOLN
15.0000 mg | Freq: Once | INTRAMUSCULAR | Status: DC | PRN
  Filled 2012-04-17: qty 1

## 2012-04-17 MED ORDER — ONDANSETRON HCL 4 MG/2ML IJ SOLN
INTRAMUSCULAR | Status: DC | PRN
  Administered 2012-04-17: 4 mg via INTRAVENOUS

## 2012-04-17 MED ORDER — MIDAZOLAM HCL 5 MG/5ML IJ SOLN
INTRAMUSCULAR | Status: DC | PRN
  Administered 2012-04-17: 2 mg via INTRAVENOUS

## 2012-04-17 MED ORDER — LIDOCAINE HCL (CARDIAC) 20 MG/ML IV SOLN
INTRAVENOUS | Status: DC | PRN
  Administered 2012-04-17: 80 mg via INTRAVENOUS

## 2012-04-17 MED ORDER — LACTATED RINGERS IV SOLN
INTRAVENOUS | Status: DC
  Administered 2012-04-17: 07:00:00 via INTRAVENOUS
  Filled 2012-04-17: qty 1000

## 2012-04-17 SURGICAL SUPPLY — 23 items
BANDAGE CO FLEX L/F 1IN X 5YD (GAUZE/BANDAGES/DRESSINGS) ×4 IMPLANT
BANDAGE CONFORM 2  STR LF (GAUZE/BANDAGES/DRESSINGS) IMPLANT
BLADE SURG 15 STRL LF DISP TIS (BLADE) ×1 IMPLANT
BLADE SURG 15 STRL SS (BLADE) ×2
CLOTH BEACON ORANGE TIMEOUT ST (SAFETY) ×2 IMPLANT
COVER MAYO STAND STRL (DRAPES) ×2 IMPLANT
COVER TABLE BACK 60X90 (DRAPES) ×2 IMPLANT
DRAPE PED LAPAROTOMY (DRAPES) ×2 IMPLANT
ELECT REM PT RETURN 9FT ADLT (ELECTROSURGICAL) ×2
ELECTRODE REM PT RTRN 9FT ADLT (ELECTROSURGICAL) ×1 IMPLANT
GAUZE VASELINE 1X8 (GAUZE/BANDAGES/DRESSINGS) ×2 IMPLANT
GLOVE BIO SURGEON STRL SZ7 (GLOVE) ×4 IMPLANT
GOWN PREVENTION PLUS LG XLONG (DISPOSABLE) ×4 IMPLANT
NEEDLE HYPO 25X1 1.5 SAFETY (NEEDLE) ×2 IMPLANT
NS IRRIG 500ML POUR BTL (IV SOLUTION) ×2 IMPLANT
PACK BASIN DAY SURGERY FS (CUSTOM PROCEDURE TRAY) ×2 IMPLANT
PENCIL BUTTON HOLSTER BLD 10FT (ELECTRODE) ×2 IMPLANT
SUT CHROMIC 4 0 P 3 18 (SUTURE) ×4 IMPLANT
SUT CHROMIC 5 0 RB 1 27 (SUTURE) IMPLANT
SYR CONTROL 10ML LL (SYRINGE) ×2 IMPLANT
TOWEL OR 17X24 6PK STRL BLUE (TOWEL DISPOSABLE) ×4 IMPLANT
TRAY DSU PREP LF (CUSTOM PROCEDURE TRAY) ×2 IMPLANT
WATER STERILE IRR 500ML POUR (IV SOLUTION) IMPLANT

## 2012-04-17 NOTE — Transfer of Care (Signed)
Immediate Anesthesia Transfer of Care Note  Patient: Dakota Jackson  Procedure(s) Performed: Procedure(s) (LRB) with comments: CIRCUMCISION ADULT (N/A)  Patient Location: PACU  Anesthesia Type:General  Level of Consciousness: awake, alert  and oriented  Airway & Oxygen Therapy: Patient Spontanous Breathing and Patient connected to nasal cannula oxygen  Post-op Assessment: Report given to PACU RN  Post vital signs: Reviewed and stable  Complications: No apparent anesthesia complications

## 2012-04-17 NOTE — Op Note (Signed)
Dakota Jackson is a 46 y.o.   04/17/2012  Preop diagnosis: Phimosis  Postop diagnosis: Same  Procedure done: Circumcision  Surgeon: Wendie Simmer. Arath Kaigler  Anesthesia: General  Indication: Patient is a 47 years old male who has been complaining of difficulty retracting his foreskin. He also has been having multiple breakdowns of his foreskin after intercourse. He was found physical examination to have phimosis. He is scheduled for circumcision.  Procedure: Patient was identified by his wrist band and proper timeout was taken.  Under general anesthesia he was prepped and draped and placed in the supine position. A penile block was done with 0.5% Marcaine. Then 2 circumferential incisions were made on the foreskin and the foreskin in between those 2 incisions was excised. Hemostasis was secured with electrocautery. Skin approximation was done with #4-0 chromic.  The patient tolerated the procedure well and left the OR in satisfactory condition to postanesthesia care unit  EBL: Minimal

## 2012-04-17 NOTE — Anesthesia Postprocedure Evaluation (Signed)
  Anesthesia Post-op Note  Patient: Dakota Jackson  Procedure(s) Performed: Procedure(s) (LRB): CIRCUMCISION ADULT (N/A)  Patient Location: PACU  Anesthesia Type: General  Level of Consciousness: awake and alert   Airway and Oxygen Therapy: Patient Spontanous Breathing  Post-op Pain: mild  Post-op Assessment: Post-op Vital signs reviewed, Patient's Cardiovascular Status Stable, Respiratory Function Stable, Patent Airway and No signs of Nausea or vomiting  Last Vitals:  Filed Vitals:   04/17/12 0659  BP: 126/82  Pulse: 70  Temp: 36.3 C  Resp: 20    Post-op Vital Signs: stable   Complications: No apparent anesthesia complications

## 2012-04-17 NOTE — Anesthesia Preprocedure Evaluation (Signed)
Anesthesia Evaluation    Airway Mallampati: II TM Distance: >3 FB Neck ROM: Full    Dental No notable dental hx.    Pulmonary Current Smoker,  breath sounds clear to auscultation  Pulmonary exam normal       Cardiovascular Rhythm:Regular Rate:Normal     Neuro/Psych    GI/Hepatic   Endo/Other    Renal/GU      Musculoskeletal   Abdominal   Peds  Hematology   Anesthesia Other Findings   Reproductive/Obstetrics                           Anesthesia Physical Anesthesia Plan  ASA: II  Anesthesia Plan: General   Post-op Pain Management:    Induction: Intravenous  Airway Management Planned: LMA  Additional Equipment:   Intra-op Plan:   Post-operative Plan: Extubation in OR  Informed Consent: I have reviewed the patients History and Physical, chart, labs and discussed the procedure including the risks, benefits and alternatives for the proposed anesthesia with the patient or authorized representative who has indicated his/her understanding and acceptance.   Dental advisory given  Plan Discussed with: CRNA and Surgeon  Anesthesia Plan Comments:         Anesthesia Quick Evaluation

## 2012-04-18 ENCOUNTER — Encounter (HOSPITAL_BASED_OUTPATIENT_CLINIC_OR_DEPARTMENT_OTHER): Payer: Self-pay | Admitting: Urology

## 2012-04-20 ENCOUNTER — Encounter: Payer: Self-pay | Admitting: Internal Medicine

## 2012-04-20 ENCOUNTER — Ambulatory Visit (INDEPENDENT_AMBULATORY_CARE_PROVIDER_SITE_OTHER): Payer: BC Managed Care – PPO | Admitting: Internal Medicine

## 2012-04-20 ENCOUNTER — Other Ambulatory Visit (INDEPENDENT_AMBULATORY_CARE_PROVIDER_SITE_OTHER): Payer: BC Managed Care – PPO

## 2012-04-20 VITALS — BP 120/82 | HR 69 | Temp 97.8°F | Resp 16 | Wt 194.8 lb

## 2012-04-20 DIAGNOSIS — B351 Tinea unguium: Secondary | ICD-10-CM

## 2012-04-20 DIAGNOSIS — M25519 Pain in unspecified shoulder: Secondary | ICD-10-CM

## 2012-04-20 DIAGNOSIS — M25512 Pain in left shoulder: Secondary | ICD-10-CM

## 2012-04-20 LAB — COMPREHENSIVE METABOLIC PANEL
Albumin: 4 g/dL (ref 3.5–5.2)
BUN: 17 mg/dL (ref 6–23)
Calcium: 9.2 mg/dL (ref 8.4–10.5)
Chloride: 100 mEq/L (ref 96–112)
Creatinine, Ser: 1.2 mg/dL (ref 0.4–1.5)
GFR: 80.27 mL/min (ref >60.00)
Glucose, Bld: 84 mg/dL (ref 70–99)
Potassium: 4.1 mEq/L (ref 3.5–5.1)

## 2012-04-20 MED ORDER — TERBINAFINE HCL 250 MG PO TABS: 250.0000 mg | ORAL_TABLET | Freq: Every day | ORAL | Status: DC

## 2012-04-20 NOTE — Assessment & Plan Note (Signed)
LFT's are normal and no side effects He can continue taking lamisil

## 2012-04-20 NOTE — Patient Instructions (Addendum)
Shoulder Pain The shoulder is the joint that connects your arms to your body. The bones that form the shoulder joint include the upper arm bone (humerus), the shoulder blade (scapula), and the collarbone (clavicle). The top of the humerus is shaped like a ball and fits into a rather flat socket on the scapula (glenoid cavity). A combination of muscles and strong, fibrous tissues that connect muscles to bones (tendons) support your shoulder joint and hold the ball in the socket. Small, fluid-filled sacs (bursae) are located in different areas of the joint. They act as cushions between the bones and the overlying soft tissues and help reduce friction between the gliding tendons and the bone as you move your arm. Your shoulder joint allows a wide range of motion in your arm. This range of motion allows you to do things like scratch your back or throw a ball. However, this range of motion also makes your shoulder more prone to pain from overuse and injury. Causes of shoulder pain can originate from both injury and overuse and usually can be grouped in the following four categories:  Redness, swelling, and pain (inflammation) of the tendon (tendinitis) or the bursae (bursitis).  Instability, such as a dislocation of the joint.  Inflammation of the joint (arthritis).  Broken bone (fracture). HOME CARE INSTRUCTIONS   Apply ice to the sore area.  Put ice in a plastic bag.  Place a towel between your skin and the bag.  Leave the ice on for 15 to 20 minutes, 3 to 4 times per day for the first 2 days.  If you have a shoulder sling or immobilizer, wear it as long as your caregiver instructs. Only remove it to shower or bathe. Move your arm as little as possible, but keep your hand moving to prevent swelling.  Only take over-the-counter or prescription medicines for pain, discomfort, or fever as directed by your caregiver. SEEK MEDICAL CARE IF:   Your shoulder pain increases, or new pain develops in  your arm, hand, or fingers.  Your hand or fingers become cold and numb.  Your pain is not relieved with medicines. SEEK IMMEDIATE MEDICAL CARE IF:   Your arm, hand, or fingers are numb or tingling.  Your arm, hand, or fingers are significantly swollen or turn white or blue. MAKE SURE YOU:   Understand these instructions.  Will watch your condition.  Will get help right away if you are not doing well or get worse. Document Released: 12/15/2004 Document Revised: 05/30/2011 Document Reviewed: 02/19/2011 Exit[employer] Patient Information 2013 [employer].  

## 2012-04-20 NOTE — Assessment & Plan Note (Signed)
He still has a lot of pain despite taking 2 meds I have asked him to see ortho, question additional treatment options

## 2012-04-20 NOTE — Progress Notes (Signed)
  Subjective:    Patient ID: Dakota Jackson, male    DOB: 1965-08-11, 47 y.o.   MRN: [id number]  Shoulder Pain  The pain is present in the right shoulder and left shoulder. This is a recurrent problem. The current episode started more than 1 month ago. There has been no history of extremity trauma. The problem occurs constantly. The problem has been unchanged. The quality of the pain is described as aching. The pain is at a severity of 3/10. The pain is moderate. Pertinent negatives include no fever, inability to bear weight, itching, joint locking, joint swelling, numbness, stiffness or tingling. He has tried acetaminophen, NSAIDS and oral narcotics for the symptoms. The treatment provided moderate relief. His past medical history is significant for osteoarthritis.      Review of Systems  Constitutional: Negative for fever, chills, diaphoresis, activity change, appetite change, fatigue and unexpected weight change.  HENT: Negative.   Eyes: Negative.   Respiratory: Negative for cough, chest tightness, shortness of breath, wheezing and stridor.   Cardiovascular: Negative for chest pain, palpitations and leg swelling.  Gastrointestinal: Negative for nausea, vomiting, abdominal pain, diarrhea and constipation.  Genitourinary: Negative.   Musculoskeletal: Positive for arthralgias. Negative for myalgias, back pain, joint swelling, gait problem and stiffness.  Skin: Negative for color change, itching, pallor, rash and wound.  Neurological: Negative.  Negative for tingling and numbness.  Hematological: Negative for adenopathy. Does not bruise/bleed easily.  Psychiatric/Behavioral: Negative.        Objective:   Physical Exam  Vitals reviewed. Constitutional: He is oriented to person, place, and time. He appears well-developed and well-nourished. No distress.  HENT:  Head: Normocephalic and atraumatic.  Mouth/Throat: Oropharynx is clear and moist. No oropharyngeal exudate.  Eyes:  Conjunctivae normal are normal. Right eye exhibits no discharge. Left eye exhibits no discharge. No scleral icterus.  Neck: Normal range of motion. Neck supple. No JVD present. No tracheal deviation present. No thyromegaly present.  Cardiovascular: Normal rate, regular rhythm, normal heart sounds and intact distal pulses.  Exam reveals no gallop and no friction rub.   No murmur heard. Pulmonary/Chest: Effort normal and breath sounds normal. No stridor. No respiratory distress. He has no wheezes. He has no rales. He exhibits no tenderness.  Abdominal: Soft. Bowel sounds are normal. He exhibits no distension and no mass. There is no tenderness. There is no rebound and no guarding.  Musculoskeletal: Normal range of motion. He exhibits no edema and no tenderness.  Lymphadenopathy:    He has no cervical adenopathy.  Neurological: He is oriented to person, place, and time.  Skin: Skin is warm and dry. No rash noted. He is not diaphoretic. No erythema. No pallor.  Psychiatric: He has a normal mood and affect. His behavior is normal. Judgment and thought content normal.      Lab Results  Component Value Date   WBC 5.1 03/19/2012   HGB 15.7 04/17/2012   HCT 44.2 03/19/2012   PLT 274.0 03/19/2012   GLUCOSE 108* 03/19/2012   CHOL 157 03/19/2012   TRIG 86.0 03/19/2012   HDL 43.70 03/19/2012   LDLCALC 96 03/19/2012   ALT 40 03/19/2012   AST 36 03/19/2012   NA 141 03/19/2012   K 4.4 03/19/2012   CL 104 03/19/2012   CREATININE 1.3 03/19/2012   BUN 20 03/19/2012   CO2 27 03/19/2012   TSH 3.22 03/19/2012   PSA 0.50 03/19/2012      Assessment & Plan:

## 2012-04-23 ENCOUNTER — Emergency Department (HOSPITAL_COMMUNITY)
Admission: EM | Admit: 2012-04-23 | Discharge: 2012-04-23 | Disposition: A | Discharge status: Home / Self Care | Payer: BC Managed Care – PPO | Attending: Emergency Medicine | Admitting: Emergency Medicine

## 2012-04-23 ENCOUNTER — Encounter (HOSPITAL_COMMUNITY): Payer: Self-pay | Admitting: Emergency Medicine

## 2012-04-23 DIAGNOSIS — I10 Essential (primary) hypertension: Secondary | ICD-10-CM | POA: Insufficient documentation

## 2012-04-23 DIAGNOSIS — K6289 Other specified diseases of anus and rectum: Secondary | ICD-10-CM | POA: Insufficient documentation

## 2012-04-23 DIAGNOSIS — M19019 Primary osteoarthritis, unspecified shoulder: Secondary | ICD-10-CM | POA: Insufficient documentation

## 2012-04-23 DIAGNOSIS — Z79899 Other long term (current) drug therapy: Secondary | ICD-10-CM | POA: Insufficient documentation

## 2012-04-23 DIAGNOSIS — F17200 Nicotine dependence, unspecified, uncomplicated: Secondary | ICD-10-CM | POA: Insufficient documentation

## 2012-04-23 DIAGNOSIS — K5732 Diverticulitis of large intestine without perforation or abscess without bleeding: Secondary | ICD-10-CM | POA: Insufficient documentation

## 2012-04-23 DIAGNOSIS — K602 Anal fissure, unspecified: Secondary | ICD-10-CM | POA: Insufficient documentation

## 2012-04-23 DIAGNOSIS — K649 Unspecified hemorrhoids: Secondary | ICD-10-CM | POA: Insufficient documentation

## 2012-04-23 NOTE — ED Notes (Signed)
Pt c/o dark red blood and pain with BM's since colonoscopy with polyp removal on 04/11/11.

## 2012-04-23 NOTE — ED Provider Notes (Signed)
History     CSN: [id number]  Arrival date & time 04/23/12  0019   First MD Initiated Contact with Patient 04/23/12 0131      Chief Complaint  Patient presents with  . Rectal Bleeding     HPI Pt was seen at 0145.   Per pt, c/o gradual onset and persistence of constant rectal "pain" and bleeding after having several hard BM's for the past several days.  Describes his stools as "very hard balls."  States his symptoms began after having a "very hard BM" several days ago.  Describes the bleeding as seeing blood on the TP after wiping and on the stool.  Denies fevers, no abd pain, no N/V/D, no bloody stools.      Past Medical History  Diagnosis Date  . Diverticulosis   . Hypertension     no meds  . Arthritis     bilateral shoulders  . Hemorrhoids     Past Surgical History  Procedure Date  . Circumcision 04/17/2012    Procedure: CIRCUMCISION ADULT;  Surgeon: Lindaann Slough, MD;  Location: [facility];  Service: Urology;  Laterality: N/A;    Family History  Problem Relation Age of Onset  . Lung cancer Mother   . Alcohol abuse Mother   . Hypertension Mother   . Cancer Neg Hx   . Arthritis Neg Hx   . Diabetes Neg Hx   . Drug abuse Neg Hx   . Early death Neg Hx   . Heart disease Neg Hx   . Hyperlipidemia Neg Hx   . Stroke Neg Hx     History  Substance Use Topics  . Smoking status: Current Every Day Smoker -- 0.5 packs/day for 30 years    Types: Cigarettes    Start date: 03/19/1982  . Smokeless tobacco: Never Used  . Alcohol Use: 6.0 oz/week    10 Cans of beer per week     Comment: haven't drank in 4 weeks       Review of Systems ROS: Statement: All systems negative except as marked or noted in the HPI; Constitutional: Negative for fever and chills. ; ; Eyes: Negative for eye pain, redness and discharge. ; ; ENMT: Negative for ear pain, hoarseness, nasal congestion, sinus pressure and sore throat. ; ; Cardiovascular: Negative for chest pain,  palpitations, diaphoresis, dyspnea and peripheral edema. ; ; Respiratory: Negative for cough, wheezing and stridor. ; ; Gastrointestinal: Negative for nausea, vomiting, diarrhea, abdominal pain, blood in stool, hematemesis, jaundice and +rectal pain, rectal bleeding. . ; ; Genitourinary: Negative for dysuria, flank pain and hematuria. ; ; Musculoskeletal: Negative for back pain and neck pain. Negative for swelling and trauma.; ; Skin: Negative for pruritus, rash, abrasions, blisters, bruising and skin lesion.; ; Neuro: Negative for headache, lightheadedness and neck stiffness. Negative for weakness, altered level of consciousness , altered mental status, extremity weakness, paresthesias, involuntary movement, seizure and syncope.       Allergies  Review of patient's allergies indicates no known allergies.  Home Medications   Current Outpatient Rx  Name  Route  Sig  Dispense  Refill  . DOCUSATE SODIUM 100 MG PO CAPS   Oral   Take 100 mg by mouth 2 (two) times daily.         Marland Kitchen HYDROCODONE-ACETAMINOPHEN 5-325 MG PO TABS   Oral   Take 1 tablet by mouth every 6 (six) hours as needed.         Marland Kitchen PREVIDENT 5000 SENSITIVE  1.1-5 % DT PSTE               . TERBINAFINE HCL 250 MG PO TABS   Oral   Take 1 tablet (250 mg total) by mouth daily.   30 tablet   2     BP 149/91  Pulse 102  Temp 98 F (36.7 C) (Oral)  Resp 18  Ht 5\' 10"  (1.778 m)  Wt 194 lb (87.998 kg)  BMI 27.84 kg/m2  SpO2 99%  Physical Exam 0150: Physical examination:  Nursing notes reviewed; Vital signs and O2 SAT reviewed;  Constitutional: Well developed, Well nourished, Well hydrated, In no acute distress; Head:  Normocephalic, atraumatic; Eyes: EOMI, PERRL, No scleral icterus; ENMT: Mouth and pharynx normal, Mucous membranes moist; Neck: Supple, Full range of motion, No lymphadenopathy; Cardiovascular: Regular rate and rhythm, No murmur, rub, or gallop; Respiratory: Breath sounds clear & equal bilaterally, No  rales, rhonchi, wheezes.  Speaking full sentences with ease, Normal respiratory effort/excursion; Chest: Nontender, Movement normal; Abdomen: Soft, Nontender, Nondistended, Normal bowel sounds. Rectal exam performed w/permission of pt and ED Tech chaparone present.  Anal tone normal.  No fecal impaction, soft brown stool in rectal vault. No anal discharge. +small tender anal fissure without active bleeding.  +external hemorrhoid without thrombosis or bleeding. No palp masses.;;; Genitourinary: No CVA tenderness; Extremities: Pulses normal, No tenderness, No edema, No calf edema or asymmetry.; Neuro: AA&Ox3, Major CN grossly intact.  Speech clear. Climbs on and off stretcher easily by himself without distress. Gait steady. No gross focal motor or sensory deficits in extremities.; Skin: Color normal, Warm, Dry.   ED Course  Procedures     MDM  MDM Reviewed: nursing note, vitals and previous chart Reviewed previous: labs      0215:  Anal fissure on exam.  External hemorrhoid also, but not thrombosed or actively bleeding.  No fecal impaction.  Abd benign. Will tx symptomatically.  Pt was just eval at his PMD's ofc 3 day ago and by his Uro MD 6 days ago; states he was rx a stool softener. Hgb 15.7 on 04/17/2012.  Dx d/w pt and family.  Questions answered.  Verb understanding, agreeable to d/c home with outpt f/u.         Laray Anger, DO 04/25/12 [phone]

## 2012-04-23 NOTE — ED Notes (Signed)
MD at bedside. 

## 2012-04-27 ENCOUNTER — Emergency Department (HOSPITAL_COMMUNITY)
Admission: EM | Admit: 2012-04-27 | Discharge: 2012-04-27 | Disposition: A | Discharge status: Home / Self Care | Payer: BC Managed Care – PPO | Attending: Emergency Medicine | Admitting: Emergency Medicine

## 2012-04-27 ENCOUNTER — Encounter (HOSPITAL_COMMUNITY): Payer: Self-pay | Admitting: Emergency Medicine

## 2012-04-27 DIAGNOSIS — I10 Essential (primary) hypertension: Secondary | ICD-10-CM | POA: Insufficient documentation

## 2012-04-27 DIAGNOSIS — F17200 Nicotine dependence, unspecified, uncomplicated: Secondary | ICD-10-CM | POA: Insufficient documentation

## 2012-04-27 DIAGNOSIS — Z8719 Personal history of other diseases of the digestive system: Secondary | ICD-10-CM | POA: Insufficient documentation

## 2012-04-27 DIAGNOSIS — Z8739 Personal history of other diseases of the musculoskeletal system and connective tissue: Secondary | ICD-10-CM | POA: Insufficient documentation

## 2012-04-27 DIAGNOSIS — K648 Other hemorrhoids: Secondary | ICD-10-CM | POA: Insufficient documentation

## 2012-04-27 HISTORY — DX: Benign neoplasm of colon, unspecified: D12.6

## 2012-04-27 MED ORDER — OXYCODONE-ACETAMINOPHEN 5-325 MG PO TABS
2.0000 | ORAL_TABLET | Freq: Once | ORAL | Status: AC
  Administered 2012-04-27: 2 via ORAL
  Filled 2012-04-27: qty 1

## 2012-04-27 MED ORDER — OXYCODONE-ACETAMINOPHEN 5-325 MG PO TABS: 2.0000 | ORAL_TABLET | ORAL | Status: DC | PRN

## 2012-04-27 MED ORDER — LORAZEPAM 2 MG/ML IJ SOLN
1.0000 mg | Freq: Once | INTRAMUSCULAR | Status: AC
  Administered 2012-04-27: 1 mg via INTRAMUSCULAR
  Filled 2012-04-27: qty 1

## 2012-04-27 MED ORDER — OXYCODONE-ACETAMINOPHEN 5-325 MG PO TABS
ORAL_TABLET | ORAL | Status: AC
  Administered 2012-04-27: 2 via ORAL
  Filled 2012-04-27: qty 1

## 2012-04-27 MED ORDER — LIDOCAINE HCL 2 % EX GEL
Freq: Once | CUTANEOUS | Status: DC
  Filled 2012-04-27: qty 10

## 2012-04-27 NOTE — ED Notes (Signed)
Pt states that he had his hemroids injected to shrink and that it has not helped and has minimal bleeding just has the pain.

## 2012-04-27 NOTE — ED Provider Notes (Signed)
History     CSN: [id number]  Arrival date & time 04/27/12  1408   First MD Initiated Contact with Patient 04/27/12 1419      Chief Complaint  Patient presents with  . Rectal Pain    (Consider location/radiation/quality/duration/timing/severity/associated sxs/prior treatment) HPI  PHU RECORD is a 47 y.o. male complaining of severe constant and rectal pain and external hemorrhoids. For the last 8 days. Patient was seen at [facility] had a colonoscopy on January 26 with sclerotherapy for his hemorrhoids. Patient denies fever, abdominal pain, nausea vomiting. He has been taking Percocet, Colace, rectal suppositories with no relief. At its least the pain is rated at 8/10. It is significantly exacerbated with palpation or defecation.  Past Medical History  Diagnosis Date  . Diverticulosis   . Hypertension     no meds  . Arthritis     bilateral shoulders  . Hemorrhoids   . Tubular adenoma of colon 03/2012    Past Surgical History  Procedure Date  . Circumcision 04/17/2012    Procedure: CIRCUMCISION ADULT;  Surgeon: Lindaann Slough, MD;  Location: [facility];  Service: Urology;  Laterality: N/A;    Family History  Problem Relation Age of Onset  . Lung cancer Mother   . Alcohol abuse Mother   . Hypertension Mother   . Cancer Neg Hx   . Arthritis Neg Hx   . Diabetes Neg Hx   . Drug abuse Neg Hx   . Early death Neg Hx   . Heart disease Neg Hx   . Hyperlipidemia Neg Hx   . Stroke Neg Hx     History  Substance Use Topics  . Smoking status: Current Every Day Smoker -- 0.5 packs/day for 30 years    Types: Cigarettes    Start date: 03/19/1982  . Smokeless tobacco: Never Used  . Alcohol Use: 6.0 oz/week    10 Cans of beer per week     Comment: haven't drank in 4 weeks       Review of Systems  Constitutional: Negative for fever.  Respiratory: Negative for shortness of breath.   Cardiovascular: Negative for chest pain.  Gastrointestinal: Positive  for rectal pain. Negative for nausea, vomiting, abdominal pain and diarrhea.  All other systems reviewed and are negative.    Allergies  Review of patient's allergies indicates no known allergies.  Home Medications   Current Outpatient Rx  Name  Route  Sig  Dispense  Refill  . DOCUSATE SODIUM 100 MG PO CAPS   Oral   Take 100 mg by mouth 2 (two) times daily as needed. constipation         . HYDROCODONE-ACETAMINOPHEN 5-325 MG PO TABS   Oral   Take 1 tablet by mouth every 6 (six) hours as needed. pain         . PREVIDENT 5000 SENSITIVE 1.1-5 % DT PSTE               . TERBINAFINE HCL 250 MG PO TABS   Oral   Take 1 tablet (250 mg total) by mouth daily.   30 tablet   2     BP 144/102  Pulse 100  Temp 98.6 F (37 C)  Resp 20  SpO2 99%  Physical Exam  Nursing note and vitals reviewed. Constitutional: He is oriented to person, place, and time. He appears well-developed and well-nourished. No distress.  HENT:  Head: Normocephalic and atraumatic.  Right Ear: External ear normal.  Left  Ear: External ear normal.  Mouth/Throat: Oropharynx is clear and moist.  Eyes: Conjunctivae normal and EOM are normal. Pupils are equal, round, and reactive to light.  Neck: Normal range of motion.  Cardiovascular: Normal rate, regular rhythm and intact distal pulses.  Exam reveals no gallop and no friction rub.   No murmur heard. Pulmonary/Chest: Effort normal and breath sounds normal. No stridor. No respiratory distress. He has no wheezes. He has no rales. He exhibits no tenderness.  Abdominal: Soft. Bowel sounds are normal. He exhibits no distension and no mass. There is no tenderness. There is no rebound and no guarding.  Genitourinary: Rectal exam shows internal hemorrhoid and tenderness.     Musculoskeletal: Normal range of motion. He exhibits no edema.  Neurological: He is alert and oriented to person, place, and time.  Psychiatric: He has a normal mood and affect.    ED  Course  Procedures (including critical care time)  Labs Reviewed - No data to display No results found.   1. Hemorrhoid prolapse       MDM  Patient with prolapsed internal hemorrhoid, tender to palpation, easily reducible, no thrombosis or signs of infection. Patient has an appointment with his gastroenterologist at [facility], Dr. Russella Dar on Monday. I will increase his pain medications to keep him comfortable until that time.   Pt verbalized understanding and agrees with care plan. Outpatient follow-up and return precautions given.    New Prescriptions   OXYCODONE-ACETAMINOPHEN (PERCOCET/ROXICET) 5-325 MG PER TABLET    Take 2 tablets by mouth every 4 (four) hours as needed for pain.           Wynetta Emery, PA-C 04/27/12 [phone]

## 2012-04-27 NOTE — ED Notes (Signed)
Pt has a ride home.  

## 2012-04-28 NOTE — ED Provider Notes (Signed)
Medical screening examination/treatment/procedure(s) were conducted as a shared visit with non-physician practitioner(s) and myself.  I personally evaluated the patient during the encounter  Derwood Kaplan, MD 04/28/12 1805

## 2012-04-30 ENCOUNTER — Ambulatory Visit (INDEPENDENT_AMBULATORY_CARE_PROVIDER_SITE_OTHER): Payer: BC Managed Care – PPO | Admitting: Gastroenterology

## 2012-04-30 ENCOUNTER — Encounter: Payer: Self-pay | Admitting: Gastroenterology

## 2012-04-30 VITALS — BP 132/80 | HR 88 | Ht 69.75 in | Wt 197.0 lb

## 2012-04-30 DIAGNOSIS — K6289 Other specified diseases of anus and rectum: Secondary | ICD-10-CM

## 2012-04-30 DIAGNOSIS — K648 Other hemorrhoids: Secondary | ICD-10-CM

## 2012-04-30 MED ORDER — LIDOCAINE-HYDROCORTISONE ACE 3-2.5 % RE KIT: PACK | RECTAL | Status: DC

## 2012-04-30 MED ORDER — DILTIAZEM GEL 2 %: 1.0000 "application " | Freq: Two times a day (BID) | CUTANEOUS | Status: DC

## 2012-04-30 NOTE — Progress Notes (Signed)
History of Present Illness: This is a 47 year old male with symptomatic internal hemorrhoids underwent colonoscopy with injection sclerosis of internal hemorrhoids on 1/21. He then underwent a circumcision on 1/28 by Dr. Brunilda Payor. He states that he was constipated and a few days following his circumcision he developed problems with ongoing, severe rectal pain. He stated that the pain started with a bowel movement. The pain worsens with a bowel movement. He presented to the ED twice and was found to have an anal fissure on his first visit and a hemorrhoid on his second visit. I reviewed the ED records.  Current Medications, Allergies, Past Medical History, Past Surgical History, Family History and Social History were reviewed in [employer] record.  Physical Exam: General: Well developed , well nourished, no acute distress Head: Normocephalic and atraumatic Eyes:  sclerae anicteric, EOMI Ears: Normal auditory acuity Mouth: No deformity or lesions Lungs: Clear throughout to auscultation Heart: Regular rate and rhythm; no murmurs, rubs or bruits Abdomen: Soft, non tender and non distended. No masses, hepatosplenomegaly or hernias noted. Normal Bowel sounds Rectal: ext hem tag, no other abnormalities noted, no fissure noted, exquisitely tender anal canal, pt could not tolerate a DRE Musculoskeletal: Symmetrical with no gross deformities  Pulses:  Normal pulses noted Extremities: No clubbing, cyanosis, edema or deformities noted Neurological: Alert oriented x 4, grossly nonfocal Psychological:  Alert and cooperative. Normal mood and affect  Assessment and Recommendations:  1. Rectal pain with an anal fissure and a hemorrhoid diagnosed in the emergency department. Internal hemorrhoids status post injection sclerosis. Recent circumcision. Small external hemorrhoidal tag. I did not see an anal fissure however my exam was incomplete due to his significant pain and his current symptoms  are typical for an anal fissure. A thrombosed internal hemorrhoid or rectal ulceration from injection sclerosis should not lead to pain but could if it extended beyond the dentate line. In addition his symptoms began about 10 days following his colonoscopy. Begin a lidocaine/HC topically and diltiazem 2% cream. Begin a daily stool softener. Surgical evaluation within the next 24-48 hours.

## 2012-04-30 NOTE — Patient Instructions (Addendum)
You have been given a separate informational sheet regarding your tobacco use, the importance of quitting and local resources to help you quit.  We have sent the following medications to your pharmacy for you to pick up at your convenience: Lidocaine-Hydrocortisone cream, Diltiazem gel at [facility].   You have been scheduled for an appointment with Dr. Derrell Lolling at [facility]. Your appointment is on 05/01/12 at 3:15pm. Please arrive at 3:00pm for registration. Make certain to bring a list of current medications, including any over the counter medications or vitamins. Also bring your co-pay if you have one as well as your insurance cards. [facility] is located at [address]. Should you need to reschedule your appointment, please contact them at [phone].  Thank you for choosing me and [facility].  Venita Lick. Pleas Koch., MD., Clementeen Graham

## 2012-05-01 ENCOUNTER — Encounter (INDEPENDENT_AMBULATORY_CARE_PROVIDER_SITE_OTHER): Payer: Self-pay | Admitting: General Surgery

## 2012-05-01 ENCOUNTER — Ambulatory Visit (INDEPENDENT_AMBULATORY_CARE_PROVIDER_SITE_OTHER): Payer: BC Managed Care – PPO | Admitting: General Surgery

## 2012-05-01 VITALS — BP 134/66 | HR 84 | Temp 97.1°F | Resp 16 | Ht 70.0 in | Wt 193.8 lb

## 2012-05-01 DIAGNOSIS — K649 Unspecified hemorrhoids: Secondary | ICD-10-CM

## 2012-05-01 MED ORDER — OXYCODONE-ACETAMINOPHEN 10-325 MG PO TABS: 1.0000 | ORAL_TABLET | ORAL | Status: DC | PRN

## 2012-05-01 MED ORDER — HYDROCORTISONE ACETATE 25 MG RE SUPP: 25.0000 mg | Freq: Two times a day (BID) | RECTAL | Status: DC

## 2012-05-01 NOTE — Progress Notes (Signed)
Patient ID: Dakota Jackson, male   DOB: 01-28-1966, 47 y.o.   MRN: [id number]  Chief Complaint  Patient presents with  . Rectal Problems    throm hems    HPI Dakota Jackson is a 47 y.o. male.  The patient is a 47 year old male who is referred by Dr. Claudette Head for an evaluation of thrombosed hemorrhoid versus possible anal fissure. Patient states he's had a long history of hemorrhoids. These were injected by Dr. Russella Dar approximately 1-2 weeks ago. The patient has had no relief since then. He continued having pain upon defecation as well as with passing gas.  Patient has been using lidocaine jelly which does give him some relief.  HPI  Past Medical History  Diagnosis Date  . Diverticulosis   . Hypertension     no meds  . Arthritis     bilateral shoulders  . Hemorrhoids   . Tubular adenoma of colon 03/2012    Past Surgical History  Procedure Laterality Date  . Circumcision  04/17/2012    Procedure: CIRCUMCISION ADULT;  Surgeon: Lindaann Slough, MD;  Location: [facility];  Service: Urology;  Laterality: N/A;    Family History  Problem Relation Age of Onset  . Lung cancer Mother   . Alcohol abuse Mother   . Hypertension Mother   . Arthritis Neg Hx   . Diabetes Neg Hx   . Drug abuse Neg Hx   . Early death Neg Hx   . Heart disease Neg Hx   . Hyperlipidemia Neg Hx   . Stroke Neg Hx   . Cancer Father     lung    Social History History  Substance Use Topics  . Smoking status: Current Every Day Smoker -- 0.50 packs/day for 30 years    Types: Cigarettes    Start date: 03/19/1982  . Smokeless tobacco: Never Used  . Alcohol Use: 6.0 oz/week    10 Cans of beer per week     Comment: haven't drank in 4 weeks     No Known Allergies  Current Outpatient Prescriptions  Medication Sig Dispense Refill  . diltiazem 2 % GEL Apply 1 application topically 2 (two) times daily.  30 g  1  . docusate sodium (COLACE) 100 MG capsule Take 100 mg by mouth 2 (two)  times daily as needed. constipation      . Lidocaine-Hydrocortisone Ace 3-2.5 % KIT Use cream rectally twice daily  30 each  1  . oxyCODONE-acetaminophen (PERCOCET/ROXICET) 5-325 MG per tablet Take 2 tablets by mouth every 4 (four) hours as needed for pain.  28 tablet  0  . PREVIDENT 5000 SENSITIVE 1.1-5 % PSTE       . terbinafine (LAMISIL) 250 MG tablet Take 1 tablet (250 mg total) by mouth daily.  30 tablet  2   No current facility-administered medications for this visit.    Review of Systems Review of Systems  Constitutional: Negative.   HENT: Negative.   Respiratory: Negative.   Cardiovascular: Negative.   Gastrointestinal: Negative.   Neurological: Negative.     Blood pressure 134/66, pulse 84, temperature 97.1 F (36.2 C), temperature source Temporal, resp. rate 16, height 5\' 10"  (1.778 m), weight 193 lb 12.8 oz (87.907 kg).  Physical Exam Physical Exam  Constitutional: He is oriented to person, place, and time. He appears well-developed and well-nourished.  HENT:  Head: Normocephalic and atraumatic.  Eyes: Conjunctivae are normal. Pupils are equal, round, and reactive to light.  Neck:  Normal range of motion. Neck supple.  Cardiovascular: Normal rate and normal heart sounds.   Pulmonary/Chest: Effort normal.  Abdominal: Soft. Bowel sounds are normal. He exhibits no distension. There is no tenderness. There is no rebound and no guarding.  Genitourinary:     Musculoskeletal: Normal range of motion.  Neurological: He is alert and oriented to person, place, and time.    Data Reviewed none  Assessment    The patient is a 47 year old male with internal versus external hemorrhoids, and/or anal fissure.     Plan    1. Secondary to patient's reluctancey for a digital rectal exam we'll proceed to the OR for exam under anesthesia. I discussed the possibilities of excision hemorrhoids versus possible lateral internal sphincterotomy. 2. All risks and benefits were  discussed with the patient, to generally include infection, bleeding, damage to surrounding structures, and recurrence. Alternatives were offered and described.  All questions were answered and the patient voiced understanding of the procedure and wishes to proceed at this point.         Marigene Ehlers., Jacquita Mulhearn 05/01/2012, 3:49 PM

## 2012-05-01 NOTE — Addendum Note (Signed)
Addended by: Axel Filler on: 05/01/2012 04:32 PM   Modules accepted: Orders

## 2012-05-09 ENCOUNTER — Encounter (INDEPENDENT_AMBULATORY_CARE_PROVIDER_SITE_OTHER): Payer: Self-pay | Admitting: General Surgery

## 2012-05-09 ENCOUNTER — Telehealth (INDEPENDENT_AMBULATORY_CARE_PROVIDER_SITE_OTHER): Payer: Self-pay | Admitting: General Surgery

## 2012-05-09 DIAGNOSIS — K602 Anal fissure, unspecified: Secondary | ICD-10-CM

## 2012-05-09 DIAGNOSIS — K644 Residual hemorrhoidal skin tags: Secondary | ICD-10-CM

## 2012-05-09 NOTE — Telephone Encounter (Signed)
Wife called to ask Dr. Derrell Lolling to call them at home.  They are confused about his surgery today and have questions about what has already been told to them.

## 2012-05-09 NOTE — Telephone Encounter (Signed)
Could you please call Bjorn Loser the wife at [phone] about this when you get a chance..I do not have an OP note because it was done at [facility]..thank you very much!!!

## 2012-05-14 ENCOUNTER — Ambulatory Visit: Payer: BC Managed Care – PPO | Admitting: Gastroenterology

## 2012-05-24 ENCOUNTER — Encounter (INDEPENDENT_AMBULATORY_CARE_PROVIDER_SITE_OTHER): Payer: Self-pay | Admitting: General Surgery

## 2012-05-24 ENCOUNTER — Ambulatory Visit (INDEPENDENT_AMBULATORY_CARE_PROVIDER_SITE_OTHER): Payer: BC Managed Care – PPO | Admitting: General Surgery

## 2012-05-24 VITALS — BP 122/80 | HR 78 | Temp 97.0°F | Resp 18 | Ht 70.5 in | Wt 202.0 lb

## 2012-05-24 DIAGNOSIS — K602 Anal fissure, unspecified: Secondary | ICD-10-CM

## 2012-05-24 NOTE — Progress Notes (Signed)
Patient ID: Dakota Jackson, male   DOB: [birth year], 47 y.o.   MRN: [id number] The patient is a 47 year old male who was recently undergone a lateral internal sphincterotomy for anal fissure. 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 The blood that he had with bowel movements has also been relieved. He states he has had some pain after bowel movements for 2-3 hours, and has been better than preoperatively.  Assessment and plan:  Patient is a 46 year oldMale status post lateral internal sphincterotomy 1. Patient can return to work in one week on 06/04/2012  2. We discussed stool softeners to help with eating his bowel movements.  3.the patient followup when necessary

## 2012-06-20 ENCOUNTER — Encounter: Payer: Self-pay | Admitting: Internal Medicine

## 2012-06-20 ENCOUNTER — Ambulatory Visit (INDEPENDENT_AMBULATORY_CARE_PROVIDER_SITE_OTHER): Payer: BC Managed Care – PPO | Admitting: Internal Medicine

## 2012-06-20 ENCOUNTER — Ambulatory Visit: Payer: BC Managed Care – PPO | Admitting: Internal Medicine

## 2012-06-20 VITALS — BP 120/72 | HR 74 | Temp 97.4°F | Resp 16 | Wt 200.8 lb

## 2012-06-20 DIAGNOSIS — B351 Tinea unguium: Secondary | ICD-10-CM

## 2012-06-20 DIAGNOSIS — M19019 Primary osteoarthritis, unspecified shoulder: Secondary | ICD-10-CM

## 2012-06-20 NOTE — Assessment & Plan Note (Signed)
Ortho referral  

## 2012-06-20 NOTE — Patient Instructions (Signed)
Shoulder Pain The shoulder is the joint that connects your arms to your body. The bones that form the shoulder joint include the upper arm bone (humerus), the shoulder blade (scapula), and the collarbone (clavicle). The top of the humerus is shaped like a ball and fits into a rather flat socket on the scapula (glenoid cavity). A combination of muscles and strong, fibrous tissues that connect muscles to bones (tendons) support your shoulder joint and hold the ball in the socket. Small, fluid-filled sacs (bursae) are located in different areas of the joint. They act as cushions between the bones and the overlying soft tissues and help reduce friction between the gliding tendons and the bone as you move your arm. Your shoulder joint allows a wide range of motion in your arm. This range of motion allows you to do things like scratch your back or throw a ball. However, this range of motion also makes your shoulder more prone to pain from overuse and injury. Causes of shoulder pain can originate from both injury and overuse and usually can be grouped in the following four categories:  Redness, swelling, and pain (inflammation) of the tendon (tendinitis) or the bursae (bursitis).  Instability, such as a dislocation of the joint.  Inflammation of the joint (arthritis).  Broken bone (fracture). HOME CARE INSTRUCTIONS   Apply ice to the sore area.  Put ice in a plastic bag.  Place a towel between your skin and the bag.  Leave the ice on for 15 to 20 minutes, 3 to 4 times per day for the first 2 days.  If you have a shoulder sling or immobilizer, wear it as long as your caregiver instructs. Only remove it to shower or bathe. Move your arm as little as possible, but keep your hand moving to prevent swelling.  Only take over-the-counter or prescription medicines for pain, discomfort, or fever as directed by your caregiver. SEEK MEDICAL CARE IF:   Your shoulder pain increases, or new pain develops in  your arm, hand, or fingers.  Your hand or fingers become cold and numb.  Your pain is not relieved with medicines. SEEK IMMEDIATE MEDICAL CARE IF:   Your arm, hand, or fingers are numb or tingling.  Your arm, hand, or fingers are significantly swollen or turn white or blue. MAKE SURE YOU:   Understand these instructions.  Will watch your condition.  Will get help right away if you are not doing well or get worse. Document Released: 12/15/2004 Document Revised: 05/30/2011 Document Reviewed: 02/19/2011 [employer] Patient Information 2013 [employer].  

## 2012-06-20 NOTE — Assessment & Plan Note (Signed)
Podiatry referral

## 2012-06-20 NOTE — Progress Notes (Signed)
  Subjective:    Patient ID: Dakota Jackson, male    DOB: 08/03/1965, 47 y.o.   MRN: [id number]  Arthritis Presents for follow-up visit. He complains of pain. He reports no stiffness, joint swelling or joint warmth. The symptoms have been worsening. Affected locations include the left shoulder and right shoulder. His pain is at a severity of 3/10. Pertinent negatives include no diarrhea or rash. Compliance with total regimen is 76-100%. Compliance with medications is 76-100%.      Review of Systems  Constitutional: Negative.   HENT: Negative.  Negative for sore throat and mouth sores.   Eyes: Negative.   Respiratory: Negative.   Cardiovascular: Negative.   Gastrointestinal: Negative.  Negative for nausea, abdominal pain, diarrhea and constipation.  Endocrine: Negative.   Genitourinary: Negative.   Musculoskeletal: Positive for arthritis. Negative for joint swelling and stiffness.  Skin: Negative.  Negative for rash.  Allergic/Immunologic: Negative.   Hematological: Negative.  Negative for adenopathy. Does not bruise/bleed easily.  Psychiatric/Behavioral: Negative.        Objective:   Physical Exam  Vitals reviewed. Constitutional: He is oriented to person, place, and time. He appears well-developed and well-nourished. No distress.  HENT:  Head: Normocephalic and atraumatic.  Mouth/Throat: Oropharynx is clear and moist. No oropharyngeal exudate.  Eyes: Conjunctivae are normal. Right eye exhibits no discharge. Left eye exhibits no discharge. No scleral icterus.  Neck: Normal range of motion. Neck supple. No JVD present. No tracheal deviation present. No thyromegaly present.  Cardiovascular: Normal rate, regular rhythm, normal heart sounds and intact distal pulses.  Exam reveals no gallop and no friction rub.   No murmur heard. Pulmonary/Chest: Effort normal and breath sounds normal. No stridor. No respiratory distress. He has no wheezes. He has no rales. He exhibits no  tenderness.  Abdominal: Soft. Bowel sounds are normal. He exhibits no distension and no mass. There is no tenderness. There is no rebound and no guarding.  Musculoskeletal: He exhibits no edema and no tenderness.  Lymphadenopathy:    He has no cervical adenopathy.  Neurological: He is oriented to person, place, and time.  Skin: Skin is warm and dry. No rash noted. He is not diaphoretic. No erythema. No pallor.  His toenails are no better, there is persistent subungual debris/nail thickening/and ingrown nails but there is no erythema/exudate/warmth  Psychiatric: He has a normal mood and affect. His behavior is normal. Judgment and thought content normal.      Lab Results  Component Value Date   WBC 5.1 03/19/2012   HGB 15.7 04/17/2012   HCT 44.2 03/19/2012   PLT 274.0 03/19/2012   GLUCOSE 84 04/20/2012   CHOL 157 03/19/2012   TRIG 86.0 03/19/2012   HDL 43.70 03/19/2012   LDLCALC 96 03/19/2012   ALT 38 04/20/2012   AST 33 04/20/2012   NA 135 04/20/2012   K 4.1 04/20/2012   CL 100 04/20/2012   CREATININE 1.2 04/20/2012   BUN 17 04/20/2012   CO2 30 04/20/2012   TSH 3.22 03/19/2012   PSA 0.50 03/19/2012      Assessment & Plan:

## 2012-11-09 ENCOUNTER — Ambulatory Visit: Payer: BC Managed Care – PPO | Admitting: Internal Medicine

## 2012-11-22 ENCOUNTER — Other Ambulatory Visit (INDEPENDENT_AMBULATORY_CARE_PROVIDER_SITE_OTHER): Payer: BC Managed Care – PPO

## 2012-11-22 ENCOUNTER — Encounter: Payer: Self-pay | Admitting: Internal Medicine

## 2012-11-22 ENCOUNTER — Ambulatory Visit (INDEPENDENT_AMBULATORY_CARE_PROVIDER_SITE_OTHER)
Admission: RE | Admit: 2012-11-22 | Discharge: 2012-11-22 | Disposition: A | Discharge status: Home / Self Care | Payer: BC Managed Care – PPO | Source: Ambulatory Visit | Attending: Internal Medicine | Admitting: Internal Medicine

## 2012-11-22 ENCOUNTER — Ambulatory Visit (INDEPENDENT_AMBULATORY_CARE_PROVIDER_SITE_OTHER): Payer: BC Managed Care – PPO | Admitting: Internal Medicine

## 2012-11-22 VITALS — BP 120/72 | HR 78 | Temp 98.7°F | Resp 16 | Wt 197.0 lb

## 2012-11-22 DIAGNOSIS — M542 Cervicalgia: Secondary | ICD-10-CM

## 2012-11-22 DIAGNOSIS — R5381 Other malaise: Secondary | ICD-10-CM | POA: Insufficient documentation

## 2012-11-22 LAB — CBC WITH DIFFERENTIAL/PLATELET
Eosinophils Absolute: 0.1 10*3/uL (ref 0.0–0.7)
Eosinophils Relative: 3 % (ref 0.0–5.0)
Lymphocytes Relative: 36 % (ref 12.0–46.0)
MCV: 86.1 fl (ref 78.0–100.0)
Monocytes Absolute: 0.6 10*3/uL (ref 0.1–1.0)
Neutrophils Relative %: 47 % (ref 43.0–77.0)
Platelets: 247 10*3/uL (ref 150.0–400.0)
RBC: 5.15 Mil/uL (ref 4.22–5.81)
WBC: 4.8 10*3/uL (ref 4.5–10.5)

## 2012-11-22 LAB — COMPREHENSIVE METABOLIC PANEL
Albumin: 3.9 g/dL (ref 3.5–5.2)
Alkaline Phosphatase: 41 U/L (ref 39–117)
BUN: 14 mg/dL (ref 6–23)
CO2: 29 mEq/L (ref 19–32)
Calcium: 9.4 mg/dL (ref 8.4–10.5)
Chloride: 104 mEq/L (ref 96–112)
Glucose, Bld: 75 mg/dL (ref 70–99)
Potassium: 4 mEq/L (ref 3.5–5.1)
Sodium: 137 mEq/L (ref 135–145)
Total Protein: 7.1 g/dL (ref 6.0–8.3)

## 2012-11-22 LAB — TSH: TSH: 2.8 u[IU]/mL (ref 0.35–5.50)

## 2012-11-22 MED ORDER — IBUPROFEN 600 MG PO TABS: 600.0000 mg | ORAL_TABLET | Freq: Three times a day (TID) | ORAL | Status: DC | PRN

## 2012-11-22 NOTE — Progress Notes (Signed)
Subjective:    Patient ID: Dakota Jackson, male    DOB: 1965/09/23, 47 y.o.   MRN: [id number]  Neck Pain  This is a new problem. The current episode started 1 to 4 weeks ago. The problem occurs intermittently. The problem has been unchanged. The pain is associated with nothing. The pain is present in the right side. The quality of the pain is described as aching. The pain is at a severity of 3/10. The pain is mild. The symptoms are aggravated by twisting and position. The pain is worse during the day. Pertinent negatives include no chest pain, fever, headaches, leg pain, numbness, pain with swallowing, paresis, photophobia, syncope, tingling, trouble swallowing, visual change, weakness or weight loss. He has tried nothing for the symptoms. The treatment provided no relief.      Review of Systems  Constitutional: Positive for fatigue. Negative for fever, chills, weight loss, diaphoresis, activity change, appetite change and unexpected weight change.  HENT: Positive for neck pain. Negative for trouble swallowing.   Eyes: Negative.  Negative for photophobia.  Respiratory: Negative.  Negative for apnea, cough, choking, shortness of breath, wheezing and stridor.   Cardiovascular: Negative.  Negative for chest pain, palpitations, leg swelling and syncope.  Gastrointestinal: Negative.  Negative for nausea, vomiting, abdominal pain, diarrhea and constipation.  Endocrine: Negative.   Genitourinary: Negative.  Negative for urgency, decreased urine volume, scrotal swelling, difficulty urinating, penile pain and testicular pain.       He complains of low libido and some ED  Skin: Negative.   Allergic/Immunologic: Negative.   Neurological: Negative.  Negative for dizziness, tingling, weakness, numbness and headaches.  Hematological: Negative.  Negative for adenopathy. Does not bruise/bleed easily.  Psychiatric/Behavioral: Negative.        Objective:   Physical Exam  Vitals  reviewed. Constitutional: He is oriented to person, place, and time. He appears well-developed and well-nourished.  Non-toxic appearance. He does not have a sickly appearance. He does not appear ill. No distress.  HENT:  Head: Normocephalic and atraumatic.  Mouth/Throat: Oropharynx is clear and moist. No oropharyngeal exudate.  Eyes: Conjunctivae are normal. Right eye exhibits no discharge. Left eye exhibits no discharge. No scleral icterus.  Neck: Normal range of motion. Neck supple. No JVD present. No tracheal deviation present. No thyromegaly present.  Cardiovascular: Normal rate, regular rhythm, normal heart sounds and intact distal pulses.  Exam reveals no gallop and no friction rub.   No murmur heard. Pulmonary/Chest: Breath sounds normal. No stridor. No respiratory distress. He has no wheezes. He has no rales. He exhibits no tenderness.  Abdominal: Soft. Bowel sounds are normal. He exhibits no distension and no mass. There is no tenderness. There is no rebound and no guarding.  Musculoskeletal: Normal range of motion. He exhibits no edema and no tenderness.       Cervical back: Normal. He exhibits normal range of motion, no tenderness, no bony tenderness, no swelling, no edema, no deformity, no laceration, no pain, no spasm and normal pulse.  Lymphadenopathy:    He has no cervical adenopathy.  Neurological: He is alert and oriented to person, place, and time. He has normal strength. He displays no atrophy, no tremor and normal reflexes. No cranial nerve deficit or sensory deficit. He exhibits normal muscle tone. He displays a negative Romberg sign. He displays no seizure activity. Coordination and gait normal.  Reflex Scores:      Tricep reflexes are 1+ on the right side and 1+ on the left side.  Bicep reflexes are 1+ on the right side and 1+ on the left side.      Brachioradialis reflexes are 1+ on the right side and 1+ on the left side.      Patellar reflexes are 1+ on the right side  and 1+ on the left side.      Achilles reflexes are 1+ on the right side and 1+ on the left side. Skin: Skin is warm and dry. No rash noted. He is not diaphoretic. No erythema. No pallor.  Psychiatric: He has a normal mood and affect. His behavior is normal. Judgment and thought content normal.     Lab Results  Component Value Date   WBC 5.1 03/19/2012   HGB 15.7 04/17/2012   HCT 44.2 03/19/2012   PLT 274.0 03/19/2012   GLUCOSE 84 04/20/2012   CHOL 157 03/19/2012   TRIG 86.0 03/19/2012   HDL 43.70 03/19/2012   LDLCALC 96 03/19/2012   ALT 38 04/20/2012   AST 33 04/20/2012   NA 135 04/20/2012   K 4.1 04/20/2012   CL 100 04/20/2012   CREATININE 1.2 04/20/2012   BUN 17 04/20/2012   CO2 30 04/20/2012   TSH 3.22 03/19/2012   PSA 0.50 03/19/2012       Assessment & Plan:

## 2012-11-22 NOTE — Patient Instructions (Signed)
Torticollis, Acute °You have suddenly (acutely) developed a twisted neck (torticollis). This is usually a self-limited condition. °CAUSES  °Acute torticollis may be caused by malposition, trauma or infection. Most commonly, acute torticollis is caused by sleeping in an awkward position. Torticollis may also be caused by the flexion, extension or twisting of the neck muscles beyond their normal position. Sometimes, the exact cause may not be known. °SYMPTOMS  °Usually, there is pain and limited movement of the neck. Your neck may twist to one side. °DIAGNOSIS  °The diagnosis is often made by physical examination. X-rays, CT scans or MRIs may be done if there is a history of trauma or concern of infection. °TREATMENT  °For a common, stiff neck that develops during sleep, treatment is focused on relaxing the contracted neck muscle. Medications (including shots) may be used to treat the problem. Most cases resolve in several days. Torticollis usually responds to conservative physical therapy. If left untreated, the shortened and spastic neck muscle can cause deformities in the face and neck. Rarely, surgery is required. °HOME CARE INSTRUCTIONS  °· Use over-the-counter and prescription medications as directed by your caregiver. °· Do stretching exercises and massage the neck as directed by your caregiver. °· Follow up with physical therapy if needed and as directed by your caregiver. °SEEK IMMEDIATE MEDICAL CARE IF:  °· You develop difficulty breathing or noisy breathing (stridor). °· You drool, develop trouble swallowing or have pain with swallowing. °· You develop numbness or weakness in the hands or feet. °· You have changes in speech or vision. °· You have problems with urination or bowel movements. °· You have difficulty walking. °· You have a fever. °· You have increased pain. °MAKE SURE YOU:  °· Understand these instructions. °· Will watch your condition. °· Will get help right away if you are not doing well or  get worse. °Document Released: 03/04/2000 Document Revised: 05/30/2011 Document Reviewed: 04/15/2009 °[employer]® Patient Information ©2014 [employer]. ° °

## 2012-11-22 NOTE — Assessment & Plan Note (Signed)
I will check his labs, including a T level, to see if he has an organic cause for fatigue

## 2012-11-22 NOTE — Assessment & Plan Note (Signed)
Xray shows a lot of disease in his C-spine He will try ibuprofen and will let me know if is he wants to evaluate and treat further with an MRI and pain management referral

## 2012-11-23 ENCOUNTER — Encounter: Payer: Self-pay | Admitting: Internal Medicine

## 2013-08-14 ENCOUNTER — Emergency Department (INDEPENDENT_AMBULATORY_CARE_PROVIDER_SITE_OTHER)
Admission: EM | Admit: 2013-08-14 | Discharge: 2013-08-14 | Disposition: A | Discharge status: Home / Self Care | Payer: BC Managed Care – PPO | Source: Home / Self Care

## 2013-08-14 ENCOUNTER — Encounter (HOSPITAL_COMMUNITY): Payer: Self-pay | Admitting: Emergency Medicine

## 2013-08-14 DIAGNOSIS — S0501XA Injury of conjunctiva and corneal abrasion without foreign body, right eye, initial encounter: Secondary | ICD-10-CM

## 2013-08-14 DIAGNOSIS — S058X9A Other injuries of unspecified eye and orbit, initial encounter: Secondary | ICD-10-CM

## 2013-08-14 DIAGNOSIS — Z9189 Other specified personal risk factors, not elsewhere classified: Secondary | ICD-10-CM

## 2013-08-14 DIAGNOSIS — X58XXXA Exposure to other specified factors, initial encounter: Secondary | ICD-10-CM

## 2013-08-14 DIAGNOSIS — H579 Unspecified disorder of eye and adnexa: Secondary | ICD-10-CM

## 2013-08-14 MED ORDER — ERYTHROMYCIN 5 MG/GM OP OINT: TOPICAL_OINTMENT | OPHTHALMIC | Status: DC

## 2013-08-14 MED ORDER — TETRACAINE HCL 0.5 % OP SOLN
2.0000 [drp] | Freq: Once | OPHTHALMIC | Status: AC
  Administered 2013-08-14: 2 [drp] via OPHTHALMIC

## 2013-08-14 MED ORDER — TETRACAINE HCL 0.5 % OP SOLN
OPHTHALMIC | Status: AC
  Filled 2013-08-14: qty 2

## 2013-08-14 NOTE — ED Provider Notes (Signed)
CSN: [id number]     Arrival date & time 08/14/13  1027 History   First MD Initiated Contact with Patient 08/14/13 1210     Chief Complaint  Patient presents with  . Eye Problem   (Consider location/radiation/quality/duration/timing/severity/associated sxs/prior Treatment) HPI Comments: Experienced FB sensation OD yesterday after work, later similar sensation OS. OD pain last night, redness and watery drainage.    Past Medical History  Diagnosis Date  . Diverticulosis   . Hypertension     no meds  . Arthritis     bilateral shoulders  . Hemorrhoids   . Tubular adenoma of colon 03/2012   Past Surgical History  Procedure Laterality Date  . Circumcision  04/17/2012    Procedure: CIRCUMCISION ADULT;  Surgeon: Hanley Ben, MD;  Location: [facility];  Service: Urology;  Laterality: N/A;   Family History  Problem Relation Age of Onset  . Lung cancer Mother   . Alcohol abuse Mother   . Hypertension Mother   . Arthritis Neg Hx   . Diabetes Neg Hx   . Drug abuse Neg Hx   . Early death Neg Hx   . Heart disease Neg Hx   . Hyperlipidemia Neg Hx   . Stroke Neg Hx   . Cancer Father     lung   History  Substance Use Topics  . Smoking status: Current Every Day Smoker -- 0.50 packs/day for 30 years    Types: Cigarettes    Start date: 03/19/1982  . Smokeless tobacco: Never Used  . Alcohol Use: 6.0 oz/week    10 Cans of beer per week     Comment: haven't drank in 4 weeks     Review of Systems  Constitutional: Negative.   HENT: Negative.   Eyes: Positive for pain and redness.  Respiratory: Negative.   All other systems reviewed and are negative.   Allergies  Review of patient's allergies indicates no known allergies.  Home Medications   Prior to Admission medications   Medication Sig Start Date End Date Taking? Authorizing Provider  ibuprofen (ADVIL,MOTRIN) 600 MG tablet Take 1 tablet (600 mg total) by mouth every 8 (eight) hours as needed for pain.  11/22/12   Janith Lima, MD   BP 140/97  Pulse 78  Temp(Src) 98.1 F (36.7 C) (Oral)  Resp 18  SpO2 99% Physical Exam  Nursing note and vitals reviewed. Constitutional: He appears well-developed and well-nourished. No distress.  Eyes: EOM are normal. Pupils are equal, round, and reactive to light.  OD: conjunctival erythema, mild conjunctival injection. Anterior chamber clear. Scant clear watery drainage.   OS: milder conjunctival erythema and scleral injection. Ant chamber clear. No drainage.   Neck: Normal range of motion. Neck supple.  Skin: Skin is warm and dry.    ED Course  Procedures (including critical care time) Labs Review Labs Reviewed - No data to display Bilat eyes anesthetized with 2 gtts tetracaine. Fluorescien stain bilat. OD with a punctate lesion with uptake. No FB within the crater. No other FB's or uptake seen. OS with no uptake or FB's. OU irrigated copiously with eye wash.  Imaging Review No results found.   MDM   1. Corneal abrasion, right   2. Potential for corneal abrasion   3. Sensation of foreign body in eye     OU exam as above No work today EYE sheets Erythromycin oint as dir For worsening, no improvement in 24h, change in vision see above eye DR.  Janne Napoleon, NP 08/14/13 1308  Janne Napoleon, NP 08/14/13 1309

## 2013-08-14 NOTE — ED Notes (Signed)
C/o FB sensation in eye since last PM. 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 Attempted removal at home by rinsing . NAD

## 2013-08-14 NOTE — Discharge Instructions (Signed)
Corneal Abrasion The cornea is the clear covering at the front and center of the eye. When looking at the colored portion of the eye (iris), you are looking through the cornea. This very thin tissue is made up of many layers. The surface layer is a single layer of cells (corneal epithelium) and is one of the most sensitive tissues in the body. If a scratch or injury causes the corneal epithelium to come off, it is called a corneal abrasion. If the injury extends to the tissues below the epithelium, the condition is called a corneal ulcer. CAUSES   Scratches.  Trauma.  Foreign body in the eye. Some people have recurrences of abrasions in the area of the original injury even after it has healed (recurrent erosion syndrome). Recurrent erosion syndrome generally improves and goes away with time. SYMPTOMS   Eye pain.  Difficulty or inability to keep the injured eye open.  The eye becomes very sensitive to light.  Recurrent erosions tend to happen suddenly, first thing in the morning, usually after waking up and opening the eye. DIAGNOSIS  Your health care provider can diagnose a corneal abrasion during an eye exam. Dye is usually placed in the eye using a drop or a small paper strip moistened by your tears. When the eye is examined with a special light, the abrasion shows up clearly because of the dye. TREATMENT   Small abrasions may be treated with antibiotic drops or ointment alone.  Usually a pressure patch is specially applied. Pressure patches prevent the eye from blinking, allowing the corneal epithelium to heal. A pressure patch also reduces the amount of pain present in the eye during healing. Most corneal abrasions heal within 2 3 days with no effect on vision. If the abrasion becomes infected and spreads to the deeper tissues of the cornea, a corneal ulcer can result. This is serious because it can cause corneal scarring. Corneal scars interfere with light passing through the cornea  and cause a loss of vision in the involved eye. HOME CARE INSTRUCTIONS  Use medicine or ointment as directed. Only take over-the-counter or prescription medicines for pain, discomfort, or fever as directed by your health care provider.  Do not drive or operate machinery while your eye is patched. Your ability to judge distances is impaired.  If your health care provider has given you a follow-up appointment, it is very important to keep that appointment. Not keeping the appointment could result in a severe eye infection or permanent loss of vision. If there is any problem keeping the appointment, let your health care provider know. SEEK MEDICAL CARE IF:   You have pain, light sensitivity, and a scratchy feeling in one eye or both eyes.  Your pressure patch keeps loosening up, and you can blink your eye under the patch after treatment.  Any kind of discharge develops from the eye after treatment or if the lids stick together in the morning.  You have the same symptoms in the morning as you did with the original abrasion days, weeks, or months after the abrasion healed. MAKE SURE YOU:   Understand these instructions.  Will watch your condition.  Will get help right away if you are not doing well or get worse. Document Released: 03/04/2000 Document Revised: 12/26/2012 Document Reviewed: 11/12/2012 [facility] Patient Information 2014 [place].  Eye, Foreign Body The term foreign body refers to any object near, on the surface of or in the eye that should not be there. A foreign body  may be a small speck of dirt or dust, a hair or eyelash, a splinter or any object. CAUSES  Foreign bodies can get in the eye by:  Flying pieces of something that was broken or destroyed (debris).  A sudden injury (trauma) to the eye. SYMPTOMS  Symptoms depend on what the foreign body is and where it is in the eye. The most common locations are:  On the inner surface of the upper or lower eyelids or  on the covering of the white part of the eye (conjunctiva). Symptoms in this location are:  Irritating and painful, especially when blinking.  Feeling like something is in the eye.  On the surface of the clear covering on the front of the eye (cornea). A corneal foreign body has symptoms that:  Are painful and irritating since the cornea is very sensitive.  Form small "rust rings" around a metallic foreign body. Metallic foreign bodies stick more firmly to the surface of the cornea.  Inside the eyeball. Infection can happen fast and can be hard to treat with antibiotics. This is an extremely dangerous situation. Foreign bodies inside the eye can threaten vision. A person may even loose their eye. Foreign bodies inside the eye may cause:  Great pain.  Immediate loss of vision. DIAGNOSIS  Foreign bodies are found during an exam by an eye specialist. Those that are on the eyelids, conjunctiva or cornea are usually (but not always) easily found. When a foreign body is inside the eyeball, a cataract may form almost right away. This makes it hard for an ophthalmologist to find the foreign body. Special tests may be needed, including ultrasound testing, X-rays and CT scans. TREATMENT   Foreign bodies that are on the eyelids, conjunctiva or cornea are often removed easily and painlessly.  If the foreign body has caused a scratch or abrasion of the cornea, antibiotic drops, ointments and/or a tight patch called a "pressure patch" may be needed. Follow-up exams will be needed for several days until the abrasion heals.  Surgery is needed right away if the foreign body is inside the eyeball. This is a medical emergency. An antibiotic therapy will likely be given to stop an infection. HOME CARE INSTRUCTIONS  The use of eye patches is not universal. Their use varies from state to state and from caregiver to caregiver. If an eye patch was applied:  Keep the eye patch on for as long as directed by your  caregiver until the follow-up appointment.  Do not remove the patch to put in medications unless instructed to do so. When replacing the patch, retape it as it was before. Follow the same procedure if the patch becomes loose.  WARNING: Do not drive or operate machinery while the eye is patched. The ability to judge distances will be impaired.  Only take over-the-counter or prescription medicines for pain, discomfort or fever as directed by the caregiver. If no eye patch was applied:  Keep the eye closed as much as possible. Do not rub the eye.  Wear dark glasses as needed to protect the eyes from bright light.  Do not wear contact lenses until the eye feels normal again, or as instructed.  Wear protective eye covering if there is a risk of eye injury. This is important when working with high speed tools.  Only take over-the-counter or prescription medicines for pain, discomfort or fever as directed by the caregiver. SEEK IMMEDIATE MEDICAL CARE IF:   Pain increases in the eye or the vision  changes.  You or your child has problems with the eye patch.  The injury to the eye appears to be getting larger.  There is discharge from the injured eye.  Swelling and/or soreness (inflammation) develops around the affected eye.  You or your child has an oral temperature above 102 F (38.9 C), not controlled by medicine.  Your baby is older than 3 months with a rectal temperature of 102 F (38.9 C) or higher.  Your baby is 74 months old or younger with a rectal temperature of 100.4 F (38 C) or higher. MAKE SURE YOU:   Understand these instructions.  Will watch your condition.  Will get help right away if you are not doing well or get worse. Document Released: 03/07/2005 Document Revised: 05/30/2011 Document Reviewed: 08/02/2012 [facility] Patient Information 2014 [place] Jackson.

## 2013-08-15 NOTE — ED Provider Notes (Signed)
Medical screening examination/treatment/procedure(s) were performed by a resident physician or non-physician practitioner and as the supervising physician I was immediately available for consultation/collaboration.  Lynne Leader, MD    Gregor Hams, MD 08/15/13 [phone]

## 2013-08-24 ENCOUNTER — Encounter (HOSPITAL_COMMUNITY): Payer: Self-pay | Admitting: Emergency Medicine

## 2013-08-24 ENCOUNTER — Emergency Department (HOSPITAL_COMMUNITY)
Admission: EM | Admit: 2013-08-24 | Discharge: 2013-08-24 | Disposition: A | Discharge status: Home / Self Care | Payer: BC Managed Care – PPO | Source: Home / Self Care | Attending: Family Medicine | Admitting: Family Medicine

## 2013-08-24 DIAGNOSIS — S61419A Laceration without foreign body of unspecified hand, initial encounter: Secondary | ICD-10-CM

## 2013-08-24 DIAGNOSIS — W269XXA Contact with unspecified sharp object(s), initial encounter: Secondary | ICD-10-CM

## 2013-08-24 DIAGNOSIS — Z23 Encounter for immunization: Secondary | ICD-10-CM

## 2013-08-24 DIAGNOSIS — S61409A Unspecified open wound of unspecified hand, initial encounter: Secondary | ICD-10-CM

## 2013-08-24 MED ORDER — TETANUS-DIPHTH-ACELL PERTUSSIS 5-2.5-18.5 LF-MCG/0.5 IM SUSP
0.5000 mL | Freq: Once | INTRAMUSCULAR | Status: AC
  Administered 2013-08-24: 0.5 mL via INTRAMUSCULAR

## 2013-08-24 MED ORDER — TETANUS-DIPHTH-ACELL PERTUSSIS 5-2.5-18.5 LF-MCG/0.5 IM SUSP
INTRAMUSCULAR | Status: AC
  Filled 2013-08-24: qty 0.5

## 2013-08-24 NOTE — ED Notes (Signed)
Reports laceration to right hand thumb side with straight razor.   Incident happened about an hour ago around 5 p.m.   Unsure of last tetanus.

## 2013-08-24 NOTE — Discharge Instructions (Signed)
Thank you for coming in today. Return in one week to remove the stitches. Come back as needed after that  Laceration Care, Adult A laceration is a cut or lesion that goes through all layers of the skin and into the tissue just beneath the skin. TREATMENT  Some lacerations may not require closure. Some lacerations may not be able to be closed due to an increased risk of infection. It is important to see your caregiver as soon as possible after an injury to minimize the risk of infection and maximize the opportunity for successful closure. If closure is appropriate, pain medicines may be given, if needed. The wound will be cleaned to help prevent infection. Your caregiver will use stitches (sutures), staples, wound glue (adhesive), or skin adhesive strips to repair the laceration. These tools bring the skin edges together to allow for faster healing and a better cosmetic outcome. However, all wounds will heal with a scar. Once the wound has healed, scarring can be minimized by covering the wound with sunscreen during the day for 1 full year. HOME CARE INSTRUCTIONS  For sutures or staples:  Keep the wound clean and dry.  If you were given a bandage (dressing), you should change it at least once a day. Also, change the dressing if it becomes wet or dirty, or as directed by your caregiver.  Wash the wound with soap and water 2 times a day. Rinse the wound off with water to remove all soap. Pat the wound dry with a clean towel.  After cleaning, apply a thin layer of the antibiotic ointment as recommended by your caregiver. This will help prevent infection and keep the dressing from sticking.  You may shower as usual after the first 24 hours. Do not soak the wound in water until the sutures are removed.  Only take over-the-counter or prescription medicines for pain, discomfort, or fever as directed by your caregiver.  Get your sutures or staples removed as directed by your caregiver. For skin  adhesive strips:  Keep the wound clean and dry.  Do not get the skin adhesive strips wet. You may bathe carefully, using caution to keep the wound dry.  If the wound gets wet, pat it dry with a clean towel.  Skin adhesive strips will fall off on their own. You may trim the strips as the wound heals. Do not remove skin adhesive strips that are still stuck to the wound. They will fall off in time. For wound adhesive:  You may briefly wet your wound in the shower or bath. Do not soak or scrub the wound. Do not swim. Avoid periods of heavy perspiration until the skin adhesive has fallen off on its own. After showering or bathing, gently pat the wound dry with a clean towel.  Do not apply liquid medicine, cream medicine, or ointment medicine to your wound while the skin adhesive is in place. This may loosen the film before your wound is healed.  If a dressing is placed over the wound, be careful not to apply tape directly over the skin adhesive. This may cause the adhesive to be pulled off before the wound is healed.  Avoid prolonged exposure to sunlight or tanning lamps while the skin adhesive is in place. Exposure to ultraviolet light in the first year will darken the scar.  The skin adhesive will usually remain in place for 5 to 10 days, then naturally fall off the skin. Do not pick at the adhesive film. You may need a  tetanus shot if:  You cannot remember when you had your last tetanus shot.  You have never had a tetanus shot. If you get a tetanus shot, your arm may swell, get red, and feel warm to the touch. This is common and not a problem. If you need a tetanus shot and you choose not to have one, there is a rare chance of getting tetanus. Sickness from tetanus can be serious. SEEK MEDICAL CARE IF:   You have redness, swelling, or increasing pain in the wound.  You see a red line that goes away from the wound.  You have yellowish-white fluid (pus) coming from the wound.  You have  a fever.  You notice a bad smell coming from the wound or dressing.  Your wound breaks open before or after sutures have been removed.  You notice something coming out of the wound such as wood or glass.  Your wound is on your hand or foot and you cannot move a finger or toe. SEEK IMMEDIATE MEDICAL CARE IF:   Your pain is not controlled with prescribed medicine.  You have severe swelling around the wound causing pain and numbness or a change in color in your arm, hand, leg, or foot.  Your wound splits open and starts bleeding.  You have worsening numbness, weakness, or loss of function of any joint around or beyond the wound.  You develop painful lumps near the wound or on the skin anywhere on your body. MAKE SURE YOU:   Understand these instructions.  Will watch your condition.  Will get help right away if you are not doing well or get worse. Document Released: 03/07/2005 Document Revised: 05/30/2011 Document Reviewed: 08/31/2010 [facility] Patient Information 2014 [place], [place].

## 2013-08-24 NOTE — ED Provider Notes (Signed)
Dakota Jackson is a 48 y.o. male who presents to Urgent Care today for right thumb laceration. Patient suffered a laceration to the right thenar eminence about one hour prior to presentation. This occurred while trying to use a razor blade to open package. He feels well and has no numbness or difficulty with hand motion. He's unsure of his last tetanus vaccination. He feels well otherwise.   Past Medical History  Diagnosis Date  . Diverticulosis   . Hypertension     no meds  . Arthritis     bilateral shoulders  . Hemorrhoids   . Tubular adenoma of colon 03/2012   History  Substance Use Topics  . Smoking status: Current Every Day Smoker -- 0.50 packs/day for 30 years    Types: Cigarettes    Start date: 03/19/1982  . Smokeless tobacco: Never Used  . Alcohol Use: 6.0 oz/week    10 Cans of beer per week     Comment: haven't drank in 4 weeks    ROS as above Medications: No current facility-administered medications for this encounter.   No current outpatient prescriptions on file.    Exam:  BP 131/83  Pulse 80  Temp(Src) 98.4 F (36.9 C) (Oral)  Resp 16  SpO2 99% Gen: Well NAD Right hand: 1 cm laceration to the right thenar eminence. The laceration extends just to the dermis but does not involve any deep structures. Thumb motion is normal in all directions. Strength is intact. Capillary refill and sensation are intact distally. Pulses are intact at the wrist.  Laceration repair:  Consent obtained and timeout performed.  1 mL of lidocaine with epinephrine injected into the wound achieving good anesthesia. Skin cleaned with Shur-Clens solution. The surrounding skin was prepped with Betadine and draped in usual sterile fashion. 3 horizontal mattress sutures using 4-0 Prolene were used to close the wound. Patient tolerated the procedure well   No results found for this or any previous visit (from the past 24 hour(s)). No results found.  Assessment and Plan: 48 y.o. male  with right thumb thenar eminence laceration. Return in one week for laceration repair. Tetanus vaccination provided.   Discussed warning signs or symptoms. Please see discharge instructions. Patient expresses understanding.    Gregor Hams, MD 08/24/13 [phone]

## 2014-01-29 LAB — HM DIABETES EYE EXAM

## 2014-06-23 ENCOUNTER — Emergency Department (INDEPENDENT_AMBULATORY_CARE_PROVIDER_SITE_OTHER)
Admission: EM | Admit: 2014-06-23 | Discharge: 2014-06-23 | Disposition: A | Discharge status: Home / Self Care | Payer: BLUE CROSS/BLUE SHIELD | Source: Home / Self Care | Attending: Family Medicine | Admitting: Family Medicine

## 2014-06-23 ENCOUNTER — Encounter (HOSPITAL_COMMUNITY): Payer: Self-pay | Admitting: Emergency Medicine

## 2014-06-23 ENCOUNTER — Emergency Department (HOSPITAL_COMMUNITY)
Admission: EM | Admit: 2014-06-23 | Discharge: 2014-06-23 | Disposition: A | Discharge status: Home / Self Care | Payer: BLUE CROSS/BLUE SHIELD | Attending: Emergency Medicine | Admitting: Emergency Medicine

## 2014-06-23 ENCOUNTER — Encounter (HOSPITAL_COMMUNITY): Payer: Self-pay

## 2014-06-23 DIAGNOSIS — Z720 Tobacco use: Secondary | ICD-10-CM | POA: Insufficient documentation

## 2014-06-23 DIAGNOSIS — R1310 Dysphagia, unspecified: Secondary | ICD-10-CM

## 2014-06-23 DIAGNOSIS — J029 Acute pharyngitis, unspecified: Secondary | ICD-10-CM | POA: Diagnosis not present

## 2014-06-23 DIAGNOSIS — K088 Other specified disorders of teeth and supporting structures: Secondary | ICD-10-CM | POA: Diagnosis not present

## 2014-06-23 DIAGNOSIS — Z86018 Personal history of other benign neoplasm: Secondary | ICD-10-CM | POA: Insufficient documentation

## 2014-06-23 DIAGNOSIS — Z8739 Personal history of other diseases of the musculoskeletal system and connective tissue: Secondary | ICD-10-CM | POA: Diagnosis not present

## 2014-06-23 DIAGNOSIS — I10 Essential (primary) hypertension: Secondary | ICD-10-CM | POA: Diagnosis not present

## 2014-06-23 LAB — RAPID STREP SCREEN (MED CTR MEBANE ONLY): Streptococcus, Group A Screen (Direct): NEGATIVE

## 2014-06-23 MED ORDER — DEXAMETHASONE SODIUM PHOSPHATE 10 MG/ML IJ SOLN
10.0000 mg | Freq: Once | INTRAMUSCULAR | Status: AC
  Administered 2014-06-23: 10 mg via INTRAMUSCULAR
  Filled 2014-06-23: qty 1

## 2014-06-23 MED ORDER — IBUPROFEN 400 MG PO TABS
800.0000 mg | ORAL_TABLET | Freq: Once | ORAL | Status: AC
  Administered 2014-06-23: 800 mg via ORAL
  Filled 2014-06-23: qty 2

## 2014-06-23 NOTE — ED Notes (Signed)
Bed: UC01 Expected date: 06/23/14 Expected time: 1:45 PM Means of arrival:  Comments: [place] room

## 2014-06-23 NOTE — Discharge Instructions (Signed)

## 2014-06-23 NOTE — ED Notes (Signed)
Declined W/C at D/C and was escorted to lobby by RN. 

## 2014-06-23 NOTE — ED Provider Notes (Signed)
CSN: [id number]     Arrival date & time 06/23/14  1423 History  This chart was scribed for non-physician practitioner, Glendell Docker, NP working with Dorie Rank, MD by Terressa Koyanagi, ED Scribe. This Jackson was seen in room TR06C/TR06C and Dakota Jackson's care was started at 3:29 PM.  First MD Initiated Contact with Jackson 06/23/14 1528     Chief Complaint  Jackson presents with  . Dental Pain  . Sore Throat   Jackson is a 49 y.o. male presenting with pharyngitis. [facility] history is provided by Dakota Jackson. No language interpreter was used.  Sore Throat   PCP: Scarlette Calico, MD HPI Comments: DEKLIN BIELER is a 49 y.o. male who presents to [facility] complaining of right upper gum line pain and associated sore throat and trouble swallowing onset yesterday morning around 11AM. Pt rates his pain a 10 out of 10. Pt notes he was seen at [facility] earlier today for [facility] same. Pt denies fever, chills, taking any measures at home for his Sx, sick contact, taking any meds today.   Past Medical History  Diagnosis Date  . Diverticulosis   . Hypertension     no meds  . Arthritis     bilateral shoulders  . Hemorrhoids   . Tubular adenoma of colon 03/2012   Past Surgical History  Procedure Laterality Date  . Circumcision  04/17/2012    Procedure: CIRCUMCISION ADULT;  Surgeon: Hanley Ben, MD;  Location: [facility];  Service: Urology;  Laterality: N/A;   Family History  Problem Relation Age of Onset  . Lung cancer Mother   . Alcohol abuse Mother   . Hypertension Mother   . Arthritis Neg Hx   . Diabetes Neg Hx   . Drug abuse Neg Hx   . Early death Neg Hx   . Heart disease Neg Hx   . Hyperlipidemia Neg Hx   . Stroke Neg Hx   . Cancer Father     lung   History  Substance Use Topics  . Smoking status: Current Every Day Smoker -- 0.50 packs/day for 30 years    Types: Cigarettes    Start date: 03/19/1982  . Smokeless tobacco: Never Used  . Alcohol  Use: 6.0 oz/week    10 Cans of beer per week     Comment: haven't drank in 4 weeks     Review of Systems  Constitutional: Negative for fever and chills.  HENT: Positive for dental problem, sore throat and trouble swallowing.   Psychiatric/Behavioral: Negative for confusion.  All other systems reviewed and are negative.  Allergies  Review of Jackson's allergies indicates no known allergies.  Home Medications   Prior to Admission medications   Not on File   Triage Vitals: BP 168/101 mmHg  Pulse 92  Temp(Src) 99 F (37.2 C) (Oral)  Resp 17  Ht 5\' 11"  (1.803 m)  Wt 186 lb (84.369 kg)  BMI 25.95 kg/m2  SpO2 100% Physical Exam  Constitutional: He is oriented to person, place, and time. He appears well-developed and well-nourished. No distress.  HENT:  Head: Normocephalic and atraumatic.  Right Ear: External ear normal.  Left Ear: External ear normal.  Mouth/Throat: Posterior oropharyngeal erythema present. No tonsillar abscesses.  Eyes: Conjunctivae and EOM are normal.  Neck: Neck supple.  Cardiovascular: Normal rate.   Pulmonary/Chest: Effort normal. No respiratory distress.  Musculoskeletal: Normal range of motion.  Neurological: He is alert and oriented to person, place, and time.  Skin: Skin is warm and dry.  Psychiatric: He has a normal mood and affect. His behavior is normal.  Nursing note and vitals reviewed.   ED Course  Procedures (including critical care time) DIAGNOSTIC STUDIES: Oxygen Saturation is 100% on RA, nl by my interpretation.    COORDINATION OF CARE: 3:31 PM-Discussed treatment plan with pt at bedside and pt agreed to plan.   Labs Review Labs Reviewed  RAPID STREP SCREEN    Imaging Review No results found.   EKG Interpretation None      MDM   Final diagnoses:  Pharyngitis    Negative strep. No oral swelling noted. Pt in no distress.pt given decadron for pain  I personally performed [facility] services described in this documentation,  which was scribed in my presence. [facility] recorded information has been reviewed and is accurate.     Glendell Docker, NP 06/23/14 [place], MD 06/24/14 [phone]

## 2014-06-23 NOTE — ED Provider Notes (Signed)
Dakota Jackson is a 49 y.o. male who presents to Urgent Care today for Difficulty swallowing. Patient bit his tongue yesterday. Today after he ate he developed significant pain and inability to swallow. He is unable to swallow liquids including his oral secretions. He is able to breathe normally. No vomiting or diarrhea. He ate a small amount of beans corn rice and chicken. He's not sure if anything got stuck. Pain started about 30 minutes after eating.   Past Medical History  Diagnosis Date  . Diverticulosis   . Hypertension     no meds  . Arthritis     bilateral shoulders  . Hemorrhoids   . Tubular adenoma of colon 03/2012   Past Surgical History  Procedure Laterality Date  . Circumcision  04/17/2012    Procedure: CIRCUMCISION ADULT;  Surgeon: Hanley Ben, MD;  Location: [facility];  Service: Urology;  Laterality: N/A;   History  Substance Use Topics  . Smoking status: Current Every Day Smoker -- 0.50 packs/day for 30 years    Types: Cigarettes    Start date: 03/19/1982  . Smokeless tobacco: Never Used  . Alcohol Use: 6.0 oz/week    10 Cans of beer per week     Comment: haven't drank in 4 weeks    ROS as above Medications: No current facility-administered medications for this encounter.   No current outpatient prescriptions on file.   No Known Allergies   Exam:  BP 162/89 mmHg  Pulse 86  Temp(Src) 98.8 F (37.1 C) (Oral)  Resp 16  SpO2 100% Gen: Well NAD HEENT: EOMI,  MMM no obvious a large tongue swelling or palate swelling.. Tender bilateral cervical lymph nodes Lungs: Normal work of breathing. CTABL Heart: RRR no MRG Abd: NABS, Soft. Nondistended, Nontender Exts: Brisk capillary refill, warm and well perfused.   No results found for this or any previous visit (from the past 24 hour(s)). No results found.  Assessment and Plan: 49 y.o. male with inability to swallow. Unclear if this is related to pain or true obstruction. Patient states  that he is unable to swallow liquids and handle his oral secretions. Transfer to ED via shuttle for evaluation and management.  Discussed warning signs or symptoms. Please see discharge instructions. Patient expresses understanding.     Gregor Hams, MD 06/23/14 [phone]

## 2014-06-23 NOTE — ED Notes (Addendum)
Pt here for right upper gum line pain and throat hurting when he swallows since 1100 am this morning. Has not been around anyone that has been sick.

## 2014-06-23 NOTE — ED Notes (Signed)
Patient c/o biting his tongue yesterday. He reports he is having trouble breathing and swallowing. Patient reports that he has extreme pain in his neck and head after taking a few bites of food. Patient is sitting upright and speaking in complete sentences.

## 2014-06-24 ENCOUNTER — Emergency Department (HOSPITAL_COMMUNITY): Payer: BLUE CROSS/BLUE SHIELD

## 2014-06-24 ENCOUNTER — Inpatient Hospital Stay (HOSPITAL_COMMUNITY)
Admission: EM | Admit: 2014-06-24 | Discharge: 2014-07-03 | DRG: 004 | Disposition: A | Discharge status: Home / Self Care | Payer: BLUE CROSS/BLUE SHIELD | Attending: Internal Medicine | Admitting: Internal Medicine

## 2014-06-24 ENCOUNTER — Encounter (HOSPITAL_COMMUNITY): Payer: Self-pay | Admitting: Emergency Medicine

## 2014-06-24 DIAGNOSIS — Z789 Other specified health status: Secondary | ICD-10-CM

## 2014-06-24 DIAGNOSIS — J0510 Acute epiglottitis without obstruction: Secondary | ICD-10-CM | POA: Diagnosis present

## 2014-06-24 DIAGNOSIS — A419 Sepsis, unspecified organism: Principal | ICD-10-CM | POA: Diagnosis present

## 2014-06-24 DIAGNOSIS — R221 Localized swelling, mass and lump, neck: Secondary | ICD-10-CM

## 2014-06-24 DIAGNOSIS — I959 Hypotension, unspecified: Secondary | ICD-10-CM | POA: Diagnosis not present

## 2014-06-24 DIAGNOSIS — Z930 Tracheostomy status: Secondary | ICD-10-CM

## 2014-06-24 DIAGNOSIS — E1165 Type 2 diabetes mellitus with hyperglycemia: Secondary | ICD-10-CM | POA: Diagnosis present

## 2014-06-24 DIAGNOSIS — Z791 Long term (current) use of non-steroidal anti-inflammatories (NSAID): Secondary | ICD-10-CM

## 2014-06-24 DIAGNOSIS — E872 Acidosis: Secondary | ICD-10-CM | POA: Diagnosis not present

## 2014-06-24 DIAGNOSIS — N179 Acute kidney failure, unspecified: Secondary | ICD-10-CM | POA: Diagnosis not present

## 2014-06-24 DIAGNOSIS — J9600 Acute respiratory failure, unspecified whether with hypoxia or hypercapnia: Secondary | ICD-10-CM

## 2014-06-24 DIAGNOSIS — F419 Anxiety disorder, unspecified: Secondary | ICD-10-CM | POA: Diagnosis present

## 2014-06-24 DIAGNOSIS — J0430 Supraglottitis, unspecified, without obstruction: Secondary | ICD-10-CM

## 2014-06-24 DIAGNOSIS — E870 Hyperosmolality and hypernatremia: Secondary | ICD-10-CM | POA: Diagnosis present

## 2014-06-24 DIAGNOSIS — E119 Type 2 diabetes mellitus without complications: Secondary | ICD-10-CM

## 2014-06-24 DIAGNOSIS — R918 Other nonspecific abnormal finding of lung field: Secondary | ICD-10-CM | POA: Diagnosis present

## 2014-06-24 DIAGNOSIS — I10 Essential (primary) hypertension: Secondary | ICD-10-CM | POA: Diagnosis present

## 2014-06-24 DIAGNOSIS — Z801 Family history of malignant neoplasm of trachea, bronchus and lung: Secondary | ICD-10-CM

## 2014-06-24 DIAGNOSIS — R070 Pain in throat: Secondary | ICD-10-CM | POA: Diagnosis present

## 2014-06-24 DIAGNOSIS — E875 Hyperkalemia: Secondary | ICD-10-CM | POA: Diagnosis not present

## 2014-06-24 DIAGNOSIS — Z8249 Family history of ischemic heart disease and other diseases of the circulatory system: Secondary | ICD-10-CM

## 2014-06-24 DIAGNOSIS — R1310 Dysphagia, unspecified: Secondary | ICD-10-CM | POA: Diagnosis present

## 2014-06-24 DIAGNOSIS — J36 Peritonsillar abscess: Secondary | ICD-10-CM | POA: Diagnosis present

## 2014-06-24 DIAGNOSIS — F17210 Nicotine dependence, cigarettes, uncomplicated: Secondary | ICD-10-CM | POA: Diagnosis present

## 2014-06-24 DIAGNOSIS — M1990 Unspecified osteoarthritis, unspecified site: Secondary | ICD-10-CM | POA: Diagnosis present

## 2014-06-24 DIAGNOSIS — J811 Chronic pulmonary edema: Secondary | ICD-10-CM | POA: Diagnosis present

## 2014-06-24 DIAGNOSIS — J988 Other specified respiratory disorders: Secondary | ICD-10-CM | POA: Diagnosis present

## 2014-06-24 DIAGNOSIS — J0431 Supraglottitis, unspecified, with obstruction: Secondary | ICD-10-CM

## 2014-06-24 DIAGNOSIS — J384 Edema of larynx: Secondary | ICD-10-CM | POA: Diagnosis present

## 2014-06-24 DIAGNOSIS — J9601 Acute respiratory failure with hypoxia: Secondary | ICD-10-CM | POA: Diagnosis present

## 2014-06-24 DIAGNOSIS — I5031 Acute diastolic (congestive) heart failure: Secondary | ICD-10-CM | POA: Diagnosis present

## 2014-06-24 LAB — BASIC METABOLIC PANEL
ANION GAP: 8 (ref 5–15)
BUN: 15 mg/dL (ref 6–23)
CHLORIDE: 108 mmol/L (ref 96–112)
CO2: 24 mmol/L (ref 19–32)
Calcium: 9.2 mg/dL (ref 8.4–10.5)
Creatinine, Ser: 1.2 mg/dL (ref 0.50–1.35)
GFR calc Af Amer: 80 mL/min — ABNORMAL LOW (ref >90)
GFR calc non Af Amer: 69 mL/min — ABNORMAL LOW (ref >90)
Glucose, Bld: 129 mg/dL — ABNORMAL HIGH (ref 70–99)
Potassium: 4 mmol/L (ref 3.5–5.1)
Sodium: 140 mmol/L (ref 135–145)

## 2014-06-24 LAB — CBC
HCT: 43.9 % (ref 39.0–52.0)
Hemoglobin: 14.9 g/dL (ref 13.0–17.0)
MCH: 29.7 pg (ref 26.0–34.0)
MCHC: 33.9 g/dL (ref 30.0–36.0)
MCV: 87.5 fL (ref 78.0–100.0)
PLATELETS: 246 10*3/uL (ref 150–400)
RBC: 5.02 MIL/uL (ref 4.22–5.81)
RDW: 13.6 % (ref 11.5–15.5)
WBC: 15.8 10*3/uL — AB (ref 4.0–10.5)

## 2014-06-24 MED ORDER — BENZOCAINE 20 % MT SOLN
Freq: Once | OROMUCOSAL | Status: AC
  Administered 2014-06-24: 23:00:00 via OROMUCOSAL
  Filled 2014-06-24: qty 57

## 2014-06-24 MED ORDER — IOHEXOL 300 MG/ML  SOLN
80.0000 mL | Freq: Once | INTRAMUSCULAR | Status: AC | PRN
  Administered 2014-06-24: 80 mL via INTRAVENOUS

## 2014-06-24 MED ORDER — LIDOCAINE VISCOUS 2 % MT SOLN
15.0000 mL | Freq: Once | OROMUCOSAL | Status: AC
  Administered 2014-06-24: 15 mL via OROMUCOSAL
  Filled 2014-06-24: qty 15

## 2014-06-24 MED ORDER — HYDROMORPHONE HCL 1 MG/ML IJ SOLN
1.0000 mg | Freq: Once | INTRAMUSCULAR | Status: AC
  Administered 2014-06-24: 1 mg via INTRAVENOUS
  Filled 2014-06-24: qty 1

## 2014-06-24 NOTE — ED Notes (Signed)
Pt reports yesterday at lunch time it was hard for him to eat lunch. After lunch whe went to drink fluids and was unable to swallow fluids. Pt was sent to [facility] and diagnosed with pharyngitis. Pt reports he feels an Ibuprofen stuff in his throat. Pt is able to breath with no difficulty but keeps spitting intermittently.

## 2014-06-24 NOTE — ED Notes (Signed)
Pt sitting on chair bedside bed. Pt is not moaning or grunting.

## 2014-06-24 NOTE — ED Notes (Signed)
Pt dx with pharyngitis yesterday unable to manage pain with ibuprofen. Pt took ibuprofen about an hour ago and states feels like it may be stuck in throat.

## 2014-06-25 ENCOUNTER — Emergency Department (HOSPITAL_COMMUNITY): Payer: BLUE CROSS/BLUE SHIELD | Admitting: Certified Registered Nurse Anesthetist

## 2014-06-25 ENCOUNTER — Encounter (HOSPITAL_COMMUNITY): Payer: Self-pay | Admitting: Anesthesiology

## 2014-06-25 ENCOUNTER — Encounter (HOSPITAL_COMMUNITY)
Admission: EM | Disposition: A | Discharge status: Home / Self Care | Payer: Self-pay | Source: Home / Self Care | Attending: Pulmonary Disease

## 2014-06-25 ENCOUNTER — Inpatient Hospital Stay (HOSPITAL_COMMUNITY): Payer: BLUE CROSS/BLUE SHIELD

## 2014-06-25 DIAGNOSIS — J384 Edema of larynx: Secondary | ICD-10-CM | POA: Diagnosis present

## 2014-06-25 DIAGNOSIS — E875 Hyperkalemia: Secondary | ICD-10-CM | POA: Diagnosis not present

## 2014-06-25 DIAGNOSIS — R221 Localized swelling, mass and lump, neck: Secondary | ICD-10-CM | POA: Diagnosis present

## 2014-06-25 DIAGNOSIS — I10 Essential (primary) hypertension: Secondary | ICD-10-CM | POA: Diagnosis present

## 2014-06-25 DIAGNOSIS — J988 Other specified respiratory disorders: Secondary | ICD-10-CM | POA: Diagnosis present

## 2014-06-25 DIAGNOSIS — R1310 Dysphagia, unspecified: Secondary | ICD-10-CM | POA: Diagnosis present

## 2014-06-25 DIAGNOSIS — J9601 Acute respiratory failure with hypoxia: Secondary | ICD-10-CM | POA: Diagnosis present

## 2014-06-25 DIAGNOSIS — I5031 Acute diastolic (congestive) heart failure: Secondary | ICD-10-CM | POA: Diagnosis present

## 2014-06-25 DIAGNOSIS — J0510 Acute epiglottitis without obstruction: Secondary | ICD-10-CM

## 2014-06-25 DIAGNOSIS — E1165 Type 2 diabetes mellitus with hyperglycemia: Secondary | ICD-10-CM | POA: Diagnosis present

## 2014-06-25 DIAGNOSIS — E872 Acidosis: Secondary | ICD-10-CM | POA: Diagnosis not present

## 2014-06-25 DIAGNOSIS — F17210 Nicotine dependence, cigarettes, uncomplicated: Secondary | ICD-10-CM | POA: Diagnosis present

## 2014-06-25 DIAGNOSIS — Z8249 Family history of ischemic heart disease and other diseases of the circulatory system: Secondary | ICD-10-CM | POA: Diagnosis not present

## 2014-06-25 DIAGNOSIS — I959 Hypotension, unspecified: Secondary | ICD-10-CM | POA: Diagnosis not present

## 2014-06-25 DIAGNOSIS — E119 Type 2 diabetes mellitus without complications: Secondary | ICD-10-CM | POA: Diagnosis not present

## 2014-06-25 DIAGNOSIS — R070 Pain in throat: Secondary | ICD-10-CM | POA: Diagnosis not present

## 2014-06-25 DIAGNOSIS — Z791 Long term (current) use of non-steroidal anti-inflammatories (NSAID): Secondary | ICD-10-CM | POA: Diagnosis not present

## 2014-06-25 DIAGNOSIS — Z801 Family history of malignant neoplasm of trachea, bronchus and lung: Secondary | ICD-10-CM | POA: Diagnosis not present

## 2014-06-25 DIAGNOSIS — N179 Acute kidney failure, unspecified: Secondary | ICD-10-CM | POA: Diagnosis not present

## 2014-06-25 DIAGNOSIS — A419 Sepsis, unspecified organism: Secondary | ICD-10-CM | POA: Diagnosis present

## 2014-06-25 DIAGNOSIS — J810 Acute pulmonary edema: Secondary | ICD-10-CM | POA: Diagnosis not present

## 2014-06-25 DIAGNOSIS — J36 Peritonsillar abscess: Secondary | ICD-10-CM | POA: Diagnosis present

## 2014-06-25 DIAGNOSIS — E870 Hyperosmolality and hypernatremia: Secondary | ICD-10-CM | POA: Diagnosis present

## 2014-06-25 DIAGNOSIS — R918 Other nonspecific abnormal finding of lung field: Secondary | ICD-10-CM | POA: Diagnosis present

## 2014-06-25 DIAGNOSIS — F419 Anxiety disorder, unspecified: Secondary | ICD-10-CM | POA: Diagnosis present

## 2014-06-25 DIAGNOSIS — M1990 Unspecified osteoarthritis, unspecified site: Secondary | ICD-10-CM | POA: Diagnosis present

## 2014-06-25 DIAGNOSIS — Z930 Tracheostomy status: Secondary | ICD-10-CM | POA: Diagnosis not present

## 2014-06-25 HISTORY — PX: TRACHEOSTOMY TUBE PLACEMENT: SHX814

## 2014-06-25 LAB — COMPREHENSIVE METABOLIC PANEL WITH GFR
ALT: 39 U/L (ref 0–53)
AST: 43 U/L — ABNORMAL HIGH (ref 0–37)
Albumin: 3.3 g/dL — ABNORMAL LOW (ref 3.5–5.2)
Alkaline Phosphatase: 38 U/L — ABNORMAL LOW (ref 39–117)
Anion gap: 10 (ref 5–15)
BUN: 19 mg/dL (ref 6–23)
CO2: 18 mmol/L — ABNORMAL LOW (ref 19–32)
Calcium: 7.5 mg/dL — ABNORMAL LOW (ref 8.4–10.5)
Chloride: 109 mmol/L (ref 96–112)
Creatinine, Ser: 1.5 mg/dL — ABNORMAL HIGH (ref 0.50–1.35)
GFR calc Af Amer: 61 mL/min — ABNORMAL LOW
GFR calc non Af Amer: 53 mL/min — ABNORMAL LOW
Glucose, Bld: 177 mg/dL — ABNORMAL HIGH (ref 70–99)
Potassium: 6.7 mmol/L (ref 3.5–5.1)
Sodium: 137 mmol/L (ref 135–145)
Total Bilirubin: 0.9 mg/dL (ref 0.3–1.2)
Total Protein: 6.3 g/dL (ref 6.0–8.3)

## 2014-06-25 LAB — LACTIC ACID, PLASMA
LACTIC ACID, VENOUS: 4.1 mmol/L — AB (ref 0.5–2.0)
Lactic Acid, Venous: 1.7 mmol/L (ref 0.5–2.0)

## 2014-06-25 LAB — CULTURE, GROUP A STREP: Strep A Culture: NEGATIVE

## 2014-06-25 LAB — CBC
HCT: 40.6 % (ref 39.0–52.0)
Hemoglobin: 13.6 g/dL (ref 13.0–17.0)
MCH: 29.8 pg (ref 26.0–34.0)
MCHC: 33.5 g/dL (ref 30.0–36.0)
MCV: 88.8 fL (ref 78.0–100.0)
Platelets: 182 K/uL (ref 150–400)
RBC: 4.57 MIL/uL (ref 4.22–5.81)
RDW: 14.1 % (ref 11.5–15.5)
WBC: 13.5 K/uL — ABNORMAL HIGH (ref 4.0–10.5)

## 2014-06-25 LAB — MRSA PCR SCREENING: MRSA by PCR: POSITIVE — AB

## 2014-06-25 SURGERY — CREATION, TRACHEOSTOMY
Anesthesia: Monitor Anesthesia Care | Site: Neck

## 2014-06-25 MED ORDER — CLINDAMYCIN PHOSPHATE 600 MG/50ML IV SOLN
INTRAVENOUS | Status: DC | PRN
  Administered 2014-06-25: 600 mg via INTRAVENOUS

## 2014-06-25 MED ORDER — ROCURONIUM BROMIDE 50 MG/5ML IV SOLN
INTRAVENOUS | Status: AC
  Filled 2014-06-25: qty 2

## 2014-06-25 MED ORDER — MIDAZOLAM HCL 2 MG/2ML IJ SOLN
INTRAMUSCULAR | Status: AC
  Administered 2014-06-25: 2 mg
  Filled 2014-06-25: qty 2

## 2014-06-25 MED ORDER — PROPOFOL 10 MG/ML IV BOLUS
INTRAVENOUS | Status: AC
  Filled 2014-06-25: qty 20

## 2014-06-25 MED ORDER — FENTANYL CITRATE 0.05 MG/ML IJ SOLN
25.0000 ug | INTRAMUSCULAR | Status: DC | PRN
  Administered 2014-06-25 (×7): 100 ug via INTRAVENOUS
  Administered 2014-06-26: 50 ug via INTRAVENOUS
  Administered 2014-06-26 (×4): 100 ug via INTRAVENOUS
  Administered 2014-06-26: 50 ug via INTRAVENOUS
  Filled 2014-06-25 (×14): qty 2

## 2014-06-25 MED ORDER — AMPICILLIN-SULBACTAM SODIUM 3 (2-1) G IJ SOLR
3.0000 g | Freq: Four times a day (QID) | INTRAMUSCULAR | Status: AC
  Administered 2014-06-25 – 2014-07-01 (×27): 3 g via INTRAVENOUS
  Filled 2014-06-25 (×30): qty 3

## 2014-06-25 MED ORDER — MUPIROCIN 2 % EX OINT
1.0000 "application " | TOPICAL_OINTMENT | Freq: Two times a day (BID) | CUTANEOUS | Status: AC
  Administered 2014-06-25 – 2014-06-29 (×10): 1 via NASAL
  Filled 2014-06-25 (×2): qty 22

## 2014-06-25 MED ORDER — LABETALOL HCL 5 MG/ML IV SOLN
10.0000 mg | INTRAVENOUS | Status: DC | PRN
  Administered 2014-06-25: 20 mg via INTRAVENOUS
  Administered 2014-06-25 (×3): 10 mg via INTRAVENOUS
  Filled 2014-06-25 (×3): qty 4

## 2014-06-25 MED ORDER — SODIUM CHLORIDE 0.9 % IV BOLUS (SEPSIS)
1000.0000 mL | Freq: Once | INTRAVENOUS | Status: AC
  Administered 2014-06-25: 1000 mL via INTRAVENOUS

## 2014-06-25 MED ORDER — KETAMINE HCL 10 MG/ML IJ SOLN
2.0000 mg/kg | Freq: Once | INTRAMUSCULAR | Status: AC
  Administered 2014-06-25: 130 mg via INTRAVENOUS

## 2014-06-25 MED ORDER — MIDAZOLAM HCL 2 MG/2ML IJ SOLN
INTRAMUSCULAR | Status: AC
Start: 2014-06-25 — End: 2014-06-25
  Filled 2014-06-25: qty 2

## 2014-06-25 MED ORDER — CHLORHEXIDINE GLUCONATE CLOTH 2 % EX PADS
6.0000 | MEDICATED_PAD | Freq: Every day | CUTANEOUS | Status: DC
  Administered 2014-06-25 – 2014-06-30 (×4): 6 via TOPICAL

## 2014-06-25 MED ORDER — LIDOCAINE HCL (CARDIAC) 20 MG/ML IV SOLN
INTRAVENOUS | Status: AC
  Filled 2014-06-25: qty 5

## 2014-06-25 MED ORDER — LORAZEPAM 2 MG/ML IJ SOLN
0.5000 mg | INTRAMUSCULAR | Status: DC | PRN
  Administered 2014-06-25: 1 mg via INTRAVENOUS
  Administered 2014-06-25: 0.5 mg via INTRAVENOUS
  Administered 2014-06-25 – 2014-07-01 (×8): 1 mg via INTRAVENOUS
  Filled 2014-06-25 (×10): qty 1

## 2014-06-25 MED ORDER — ROCURONIUM BROMIDE 100 MG/10ML IV SOLN
INTRAVENOUS | Status: DC | PRN
  Administered 2014-06-25: 10 mg via INTRAVENOUS

## 2014-06-25 MED ORDER — FAMOTIDINE IN NACL 20-0.9 MG/50ML-% IV SOLN
20.0000 mg | Freq: Two times a day (BID) | INTRAVENOUS | Status: DC
  Administered 2014-06-25 – 2014-07-03 (×17): 20 mg via INTRAVENOUS
  Filled 2014-06-25 (×19): qty 50

## 2014-06-25 MED ORDER — SUCCINYLCHOLINE CHLORIDE 20 MG/ML IJ SOLN
INTRAMUSCULAR | Status: AC
  Filled 2014-06-25: qty 1

## 2014-06-25 MED ORDER — SODIUM POLYSTYRENE SULFONATE 15 GM/60ML PO SUSP
30.0000 g | Freq: Once | ORAL | Status: AC
  Administered 2014-06-25: 30 g via ORAL
  Filled 2014-06-25: qty 120

## 2014-06-25 MED ORDER — CLINDAMYCIN PHOSPHATE 600 MG/50ML IV SOLN
600.0000 mg | Freq: Once | INTRAVENOUS | Status: AC
  Administered 2014-06-25: 600 mg via INTRAVENOUS
  Filled 2014-06-25: qty 50

## 2014-06-25 MED ORDER — DEXMEDETOMIDINE HCL IN NACL 200 MCG/50ML IV SOLN
0.0000 ug/kg/h | INTRAVENOUS | Status: DC
  Administered 2014-06-25: 0.8 ug/kg/h via INTRAVENOUS
  Administered 2014-06-25: 1 ug/kg/h via INTRAVENOUS
  Administered 2014-06-25: 0.5 ug/kg/h via INTRAVENOUS
  Filled 2014-06-25 (×3): qty 50

## 2014-06-25 MED ORDER — CHLORHEXIDINE GLUCONATE 0.12 % MT SOLN
15.0000 mL | Freq: Two times a day (BID) | OROMUCOSAL | Status: DC
  Administered 2014-06-25 – 2014-07-03 (×13): 15 mL via OROMUCOSAL
  Filled 2014-06-25 (×18): qty 15

## 2014-06-25 MED ORDER — KETAMINE HCL 10 MG/ML IJ SOLN: 2.0000 mg/kg | Freq: Once | INTRAMUSCULAR | Status: DC

## 2014-06-25 MED ORDER — ESMOLOL HCL 10 MG/ML IV SOLN
INTRAVENOUS | Status: DC | PRN
  Administered 2014-06-25 (×2): 20 mg via INTRAVENOUS

## 2014-06-25 MED ORDER — PROPOFOL 10 MG/ML IV BOLUS
INTRAVENOUS | Status: DC | PRN
  Administered 2014-06-25 (×2): 50 mg via INTRAVENOUS

## 2014-06-25 MED ORDER — MIDAZOLAM HCL 5 MG/5ML IJ SOLN
INTRAMUSCULAR | Status: DC | PRN
  Administered 2014-06-25 (×2): 1 mg via INTRAVENOUS
  Administered 2014-06-25: 2 mg via INTRAVENOUS

## 2014-06-25 MED ORDER — PHENYLEPHRINE HCL 10 MG/ML IJ SOLN
INTRAMUSCULAR | Status: DC | PRN
  Administered 2014-06-25: 60 ug via INTRAVENOUS

## 2014-06-25 MED ORDER — ALBUTEROL SULFATE (2.5 MG/3ML) 0.083% IN NEBU: 2.5000 mg | INHALATION_SOLUTION | RESPIRATORY_TRACT | Status: DC | PRN

## 2014-06-25 MED ORDER — FENTANYL CITRATE 0.05 MG/ML IJ SOLN
25.0000 ug | Freq: Once | INTRAMUSCULAR | Status: AC
  Administered 2014-06-25: 100 ug via INTRAVENOUS

## 2014-06-25 MED ORDER — MIDAZOLAM HCL 2 MG/2ML IJ SOLN
INTRAMUSCULAR | Status: AC
  Filled 2014-06-25: qty 2

## 2014-06-25 MED ORDER — DEXAMETHASONE SODIUM PHOSPHATE 10 MG/ML IJ SOLN
10.0000 mg | Freq: Once | INTRAMUSCULAR | Status: AC
  Administered 2014-06-25: 10 mg via INTRAVENOUS
  Filled 2014-06-25: qty 1

## 2014-06-25 MED ORDER — ETOMIDATE 2 MG/ML IV SOLN
INTRAVENOUS | Status: AC
  Filled 2014-06-25: qty 20

## 2014-06-25 MED ORDER — KETAMINE HCL 10 MG/ML IJ SOLN
INTRAMUSCULAR | Status: DC | PRN
  Administered 2014-06-25: 40 mg via INTRAVENOUS
  Administered 2014-06-25: 10 mg via INTRAVENOUS
  Administered 2014-06-25: 50 mg via INTRAVENOUS
  Administered 2014-06-25: 20 mg via INTRAVENOUS
  Administered 2014-06-25: 10 mg via INTRAVENOUS
  Administered 2014-06-25: 20 mg via INTRAVENOUS
  Administered 2014-06-25: 40 mg via INTRAVENOUS
  Administered 2014-06-25: 10 mg via INTRAVENOUS

## 2014-06-25 MED ORDER — ONDANSETRON HCL 4 MG/2ML IJ SOLN
INTRAMUSCULAR | Status: DC | PRN
  Administered 2014-06-25: 4 mg via INTRAVENOUS

## 2014-06-25 MED ORDER — KETAMINE HCL 10 MG/ML IJ SOLN
INTRAMUSCULAR | Status: AC
  Filled 2014-06-25: qty 1

## 2014-06-25 MED ORDER — DEXTROSE-NACL 5-0.9 % IV SOLN
INTRAVENOUS | Status: DC
  Administered 2014-06-25 – 2014-06-26 (×2): via INTRAVENOUS

## 2014-06-25 MED ORDER — RACEPINEPHRINE HCL 2.25 % IN NEBU: 0.5000 mL | INHALATION_SOLUTION | Freq: Once | RESPIRATORY_TRACT | Status: DC

## 2014-06-25 MED ORDER — MIDAZOLAM HCL 2 MG/2ML IJ SOLN
1.0000 mg | INTRAMUSCULAR | Status: DC | PRN
  Administered 2014-06-25: 2 mg via INTRAVENOUS
  Filled 2014-06-25: qty 2

## 2014-06-25 MED ORDER — LIDOCAINE-EPINEPHRINE 1 %-1:100000 IJ SOLN
INTRAMUSCULAR | Status: DC | PRN
  Administered 2014-06-25: 5 mL

## 2014-06-25 MED ORDER — FENTANYL CITRATE 0.05 MG/ML IJ SOLN
INTRAMUSCULAR | Status: AC
  Administered 2014-06-25: 100 ug
  Filled 2014-06-25: qty 2

## 2014-06-25 MED ORDER — PROPOFOL 10 MG/ML IV EMUL
5.0000 ug/kg/min | Freq: Once | INTRAVENOUS | Status: DC
  Filled 2014-06-25: qty 100

## 2014-06-25 MED ORDER — LIDOCAINE-EPINEPHRINE 1 %-1:100000 IJ SOLN
INTRAMUSCULAR | Status: AC
Start: 2014-06-25 — End: 2014-06-25
  Filled 2014-06-25: qty 1

## 2014-06-25 MED ORDER — LACTATED RINGERS IV SOLN
INTRAVENOUS | Status: DC | PRN
  Administered 2014-06-25 (×2): via INTRAVENOUS

## 2014-06-25 MED ORDER — CETYLPYRIDINIUM CHLORIDE 0.05 % MT LIQD
7.0000 mL | Freq: Four times a day (QID) | OROMUCOSAL | Status: DC
  Administered 2014-06-25 – 2014-07-02 (×19): 7 mL via OROMUCOSAL

## 2014-06-25 MED ORDER — MIDAZOLAM HCL 2 MG/2ML IJ SOLN
1.0000 mg | Freq: Once | INTRAMUSCULAR | Status: AC
  Administered 2014-06-25: 2 mg via INTRAVENOUS
  Filled 2014-06-25: qty 2

## 2014-06-25 MED FILL — Lidocaine HCl Soln 4%: CUTANEOUS | Qty: 50 | Status: AC

## 2014-06-25 SURGICAL SUPPLY — 36 items
ATTRACTOMAT 16X20 MAGNETIC DRP (DRAPES) ×3 IMPLANT
BAG SPEC THK2 15X12 ZIP CLS (MISCELLANEOUS) ×1
BAG ZIPLOCK 12X15 (MISCELLANEOUS) ×3 IMPLANT
BLADE SURG 15 STRL LF DISP TIS (BLADE) ×1 IMPLANT
BLADE SURG 15 STRL SS (BLADE) ×3
BLADE SURG SZ11 CARB STEEL (BLADE) ×3 IMPLANT
CLEANER TIP ELECTROSURG 2X2 (MISCELLANEOUS) ×3 IMPLANT
COVER SURGICAL LIGHT HANDLE (MISCELLANEOUS) ×3 IMPLANT
DISSECTOR ROUND CHERRY 3/8 STR (MISCELLANEOUS) IMPLANT
DRAPE LAPAROSCOPIC ABDOMINAL (DRAPES) ×3 IMPLANT
ELECT COATED BLADE 2.86 ST (ELECTRODE) ×6 IMPLANT
ELECT REM PT RETURN 9FT ADLT (ELECTROSURGICAL) ×3
ELECTRODE REM PT RTRN 9FT ADLT (ELECTROSURGICAL) ×1 IMPLANT
GAUZE SPONGE 4X4 16PLY XRAY LF (GAUZE/BANDAGES/DRESSINGS) ×3 IMPLANT
GAUZE XEROFORM 5X9 LF (GAUZE/BANDAGES/DRESSINGS) IMPLANT
HEMOSTAT SURGICEL 2X14 (HEMOSTASIS) ×6 IMPLANT
HOLDER TRACH TUBE VELCRO 19.5 (MISCELLANEOUS) ×3 IMPLANT
KIT BASIN OR (CUSTOM PROCEDURE TRAY) ×3 IMPLANT
NEEDLE HYPO 25X1 1.5 SAFETY (NEEDLE) ×3 IMPLANT
NS IRRIG 1000ML POUR BTL (IV SOLUTION) ×3 IMPLANT
PACK EENT SPLIT (PACKS) ×3 IMPLANT
PENCIL BUTTON HOLSTER BLD 10FT (ELECTRODE) ×3 IMPLANT
SPONGE DRAIN TRACH 4X4 STRL 2S (GAUZE/BANDAGES/DRESSINGS) ×3 IMPLANT
SPONGE LAP 18X18 X RAY DECT (DISPOSABLE) ×3 IMPLANT
SUT CHROMIC 2 0 SH (SUTURE) ×3 IMPLANT
SUT ETHILON 2 0 PS N (SUTURE) ×6 IMPLANT
SUT SILK 2 0 30  PSL (SUTURE) ×4
SUT SILK 2 0 30 PSL (SUTURE) ×2 IMPLANT
SUT SILK 3 0 (SUTURE) ×2
SUT SILK 3-0 18XBRD TIE 12 (SUTURE) ×1 IMPLANT
SYR 20CC LL (SYRINGE) ×3 IMPLANT
SYR CONTROL 10ML LL (SYRINGE) ×3 IMPLANT
SYRINGE 10CC LL (SYRINGE) ×3 IMPLANT
TUBE TRACH SHILEY 8 DIST CUF (TUBING) ×3 IMPLANT
WATER STERILE IRR 1500ML POUR (IV SOLUTION) ×3 IMPLANT
YANKAUER SUCT BULB TIP 10FT TU (MISCELLANEOUS) ×3 IMPLANT

## 2014-06-25 NOTE — Transfer of Care (Signed)
Immediate Anesthesia Transfer of Care Note  Patient: Dakota Jackson  Procedure(s) Performed: Procedure(s): , attempted fiveroptic intubation (N/A)  Patient Location: ICU  Anesthesia Type:General  Level of Consciousness: sedated  Airway & Oxygen Therapy: Patient placed on Ventilator (see vital sign flow sheet for setting) Trach in place.  Post-op Assessment: Report given to RN, Post -op Vital signs reviewed and stable and Patient moving all extremities  Post vital signs: Reviewed and stable  Last Vitals:  Filed Vitals:   06/25/14 0145  BP:   Pulse: 123  Temp:   Resp: 25    Complications: No apparent anesthesia complications. Patient may have some memory of procedure related to awake fiberoptic attempted. Versed/ketamine given. Converted to general with trach.

## 2014-06-25 NOTE — Progress Notes (Signed)
CRITICAL VALUE ALERT  Critical value received:  Lactic acid 4.1  Date of notification:  06/25/14  Time of notification:  12:25  Critical value read back:Yes.    Nurse who received alert:  Juel Burrow, RN  MD notified (1st page):  Dr. Lamonte Sakai  Time of first page:  12:25  MD notified (2nd page):  Time of second page:  Responding MD:  Dr. Lamonte Sakai  Time MD responded:  12:25

## 2014-06-25 NOTE — ED Notes (Signed)
Correction to prior entry: Report given to Claudine Mouton, RN in ICU.

## 2014-06-25 NOTE — ED Notes (Signed)
ENT at bedside

## 2014-06-25 NOTE — Anesthesia Procedure Notes (Signed)
Procedure Name: Awake intubation Performed by: Franne Grip Pre-anesthesia Checklist: Patient identified, Emergency Drugs available, Suction available, Patient being monitored and Timeout performed Patient Re-evaluated:Patient Re-evaluated prior to inductionOxygen Delivery Method: Nasal cannula Preoxygenation: Pre-oxygenation with 100% oxygen Ventilation: Two handed mask ventilation required and Oral airway inserted - appropriate to patient size Airway Equipment and Method: Fiberoptic brochoscope,  Video-laryngoscopy and Tracheostomy Difficulty Due To: Difficulty was anticipated Comments: Attempted fiberoptic intubation under MAC. Cetacaine spray to oral cavity. Transtracheal lidocaine per Dr. [name]. Able to see enlarged epiglottis with fiberoptic scope but unable to pass scope past epiglottis. Decision made to perform sedated tracheostomy per Dr. [name]. Tracheostomy performed under MAC (propofol, versed, ketamine). General anesthesia induced once tracheostomy safely in place.

## 2014-06-25 NOTE — Brief Op Note (Addendum)
06/24/2014 - 06/25/2014  3:00 AM  PATIENT:  Dakota Jackson  49 y.o. male  PRE-OPERATIVE DIAGNOSIS:  Epiglotitis, acute airway obstruction  POST-OPERATIVE DIAGNOSIS:  Epiglotitis, acute airway obstruction  PROCEDURE:  Procedure(s): TRACHEOSTOMY (N/A)  SURGEON:  Surgeon(s) and Role:    * Leta Baptist, MD - Primary  PHYSICIAN ASSISTANT:   ASSISTANTS: none   ANESTHESIA:   IV sedation  EBL:     BLOOD ADMINISTERED:none  DRAINS: none   LOCAL MEDICATIONS USED:  LIDOCAINE   SPECIMEN:  No Specimen  DISPOSITION OF SPECIMEN:  N/A  COUNTS:  YES  TOURNIQUET:  * No tourniquets in log *  DICTATION: .Other Dictation: Dictation Number [id number]  PLAN OF CARE: Admit to inpatient   PATIENT DISPOSITION:  PACU - hemodynamically stable.   Delay start of Pharmacological VTE agent (>24hrs) due to surgical blood loss or risk of bleeding: no

## 2014-06-25 NOTE — ED Notes (Signed)
Patient is being bagged. Dr. Evelina Bucy has attempted to intubate but his epiglottitis is swollen and he is unable to visualize vocal cords. Pt is being reassured.

## 2014-06-25 NOTE — Progress Notes (Signed)
eLink Physician-Brief Progress Note Patient Name: Dakota Jackson DOB: [birth year] MRN: [id number]   Date of Service  06/25/2014  HPI/Events of Note    eICU Interventions  Full code order written SCD written for DVT prophylaxis     Intervention Category Intermediate Interventions: Best-practice therapies (e.g. DVT, beta blocker, etc.) Minor Interventions: Routine modifications to care plan (e.g. PRN medications for pain, fever)  Marili Vader 06/25/2014, 5:45 AM

## 2014-06-25 NOTE — ED Notes (Signed)
Report given to Claudine Mouton RN  In the [place]

## 2014-06-25 NOTE — ED Notes (Signed)
ENT provider has decided to intubate in the OR. Awaiting on OR assigned room.

## 2014-06-25 NOTE — ED Notes (Signed)
70mg  of ketamine was wasted in the pyxis, only 130mg  given.

## 2014-06-25 NOTE — Progress Notes (Signed)
CARE MANAGEMENT NOTE 06/25/2014  Patient:  Dakota Jackson   Account Number:  [id number]  Date Initiated:  06/25/2014  Documentation initiated by:  [place]  Subjective/Objective Assessment:   airway obstruction requiring traceostomy and vent support     Action/Plan:   tbd   Anticipated DC Date:  06/28/2014   Anticipated DC Plan:  HOME/SELF CARE  In-house referral  NA      DC Planning Services  CM consult      PAC Choice  NA   Choice offered to / List presented to:  NA           Status of service:  In process, will continue to follow Medicare Important Message given?   (If response is "NO", the following Medicare IM given date fields will be blank) Date Medicare IM given:   Medicare IM given by:   Date Additional Medicare IM given:   Additional Medicare IM given by:    Discharge Disposition:    Per UR Regulation:  Reviewed for med. necessity/level of care/duration of stay  If discussed at [place] of Stay Meetings, dates discussed:    Comments:  June 25, 2014/Ineta Sinning L. Rosana Hoes, RN, BSN, CCM. Case Management [place] [phone] No discharge needs present of time of review.

## 2014-06-25 NOTE — Progress Notes (Signed)
PT desating on ATC- worked Fio2 up to 90% on ATC- best 87-88%. RT placed PT back on vent at previous settings excluding fi02- now at 50%- RN aware.

## 2014-06-25 NOTE — Consult Note (Signed)
PULMONARY / CRITICAL CARE MEDICINE   Name: Dakota Jackson MRN: [id number] DOB: 1966/03/05    ADMISSION DATE:  06/24/2014 CONSULTATION DATE:  06/25/2014  REFERRING MD :  Dr. [name]  CHIEF COMPLAINT:  Acute epiglottitis  INITIAL PRESENTATION: 49 year-old male admitted on 06/25/2014 with acute epiglottitis requiring emergent tracheostomy.  STUDIES:  4/05  CT neck> supraglottitis noted, 8 mm right tonsillar fluid collection  SIGNIFICANT EVENTS: 4/06  emergent tracheostomy   HISTORY OF PRESENT ILLNESS:  This is a 49 year old male who was admitted on 06/24/2014 from the [facility] emergency department for evaluation of acute epiglottitis. History could not be obtained because he was sedated and on the ventilator at the time of pulmonary in critical care medicine evaluation. Per chart review it appears that the patient presented with acute sore throat and difficulty breathing. He was taken to the operating room for intubation and an awake intubation could not be performed so he had an emergent tracheostomy. Pulmonary and critical care medicine was consulted for vent management.  Wife reports patient was in his usual state of health on Monday 4/4 and went to work.  After lunch on Monday, he began having difficulty swallowing.  He went to the urgent care and was diagnosed with pharyngitis.  He developed worsening difficulty swallowing and sought care in ER as above.    PAST MEDICAL HISTORY :   has a past medical history of Diverticulosis; Hypertension; Arthritis; Hemorrhoids; and Tubular adenoma of colon (03/2012).  has past surgical history that includes Circumcision (04/17/2012).   HOME MEDICATIONS:  Prior to Admission medications   Medication Sig Start Date End Date Taking? Authorizing Provider  ibuprofen (ADVIL,MOTRIN) 200 MG tablet Take 200-400 mg by mouth every 6 (six) hours as needed (for pain.).   Yes Historical Provider, MD   No Known Allergies  FAMILY HISTORY:  indicated that his  mother is deceased. He indicated that his father is deceased.    SOCIAL HISTORY:  reports that he has been smoking Cigarettes.  He started smoking about 32 years ago. He has a 15 pack-year smoking history. He has never used smokeless tobacco. He reports that he drinks about 6.0 oz of alcohol per week. He reports that he does not use illicit drugs.  REVIEW OF SYSTEMS: Cannot obtain due to mechanical vent.   SUBJECTIVE:  RN reports pt failed ATC with desaturations, placed back on vent   VITAL SIGNS: Temp:  [97.8 F (36.6 C)-99.7 F (37.6 C)] 97.9 F (36.6 C) (04/06 1200) Pulse Rate:  [51-163] 73 (04/06 1400) Resp:  [13-31] 18 (04/06 1400) BP: (59-218)/(40-170) 190/91 mmHg (04/06 1400) SpO2:  [86 %-100 %] 96 % (04/06 1400) FiO2 (%):  [40 %-100 %] 50 % (04/06 1200) Weight:  [89 kg (196 lb 3.4 oz)] 89 kg (196 lb 3.4 oz) (04/06 0745)   HEMODYNAMICS:     VENTILATOR SETTINGS: Vent Mode:  [-] PRVC FiO2 (%):  [40 %-100 %] 50 % Set Rate:  [14 bmp] 14 bmp Vt Set:  [600 mL] 600 mL PEEP:  [5 cmH20] 5 cmH20 Plateau Pressure:  [18 cmH20-20 cmH20] 18 cmH20   INTAKE / OUTPUT:  Intake/Output Summary (Last 24 hours) at 06/25/14 1405 Last data filed at 06/25/14 1400  Gross per 24 hour  Intake 3616.68 ml  Output    425 ml  Net 3191.68 ml    PHYSICAL EXAMINATION: General:  wdwn adult male in NAD  Neuro:  Awake, alert.  MAE.  Communicates by writing HEENT:  Mm  pink/moist, no jvd, trach midline c/d/i (#8) Cardiovascular:  s1s2 rrr, no m/r/g  Lungs:  resp's even/non-labored, lungs bilaterally clear  Abdomen:  Round/soft, bsx4 active  Musculoskeletal:  No acute deformities  Skin:  Warm/dry, no edema   LABS:  CBC  Recent Labs Lab 06/24/14 2206 06/25/14 1040  WBC 15.8* 13.5*  HGB 14.9 13.6  HCT 43.9 40.6  PLT 246 182   Coag's No results for input(s): APTT, INR in the last 168 hours.   BMET  Recent Labs Lab 06/24/14 2206 06/25/14 1040  NA 140 137  K 4.0 6.7*  CL 108 109   CO2 24 18*  BUN 15 19  CREATININE 1.20 1.50*  GLUCOSE 129* 177*   Electrolytes  Recent Labs Lab 06/24/14 2206 06/25/14 1040  CALCIUM 9.2 7.5*   Sepsis Markers  Recent Labs Lab 06/25/14 1124  LATICACIDVEN 4.1*     ABG No results for input(s): PHART, PCO2ART, PO2ART in the last 168 hours.   Liver Enzymes  Recent Labs Lab 06/25/14 1040  AST 43*  ALT 39  ALKPHOS 38*  BILITOT 0.9  ALBUMIN 3.3*     Cardiac Enzymes No results for input(s): TROPONINI, PROBNP in the last 168 hours.   Glucose No results for input(s): GLUCAP in the last 168 hours.  Imaging Ct Soft Tissue Neck W Contrast  06/25/2014   CLINICAL DATA:  Throat swelling. Leukocytosis and recent pharyngitis diagnosis.  EXAM: CT NECK WITH CONTRAST  TECHNIQUE: Multidetector CT imaging of the neck was performed using the standard protocol following the bolus administration of intravenous contrast.  CONTRAST:  76mL OMNIPAQUE IOHEXOL 300 MG/ML  SOLN  COMPARISON:  None.  FINDINGS: Pharynx and larynx: There is marked low-density thickening of the supraglottic larynx, especially of the epiglottis and aryepiglottic folds. There is narrowing of the supraglottic larynx, but no critical stenosis at this time. The true vocal folds appear normal, as does the infraglottic airway. There is parapharyngeal edema, greater on the right, but no retropharyngeal edema or abscess. This parapharyngeal fluid, leukocytosis, and history is more consistent with infection than angioedema. 7 mm peripherally enhancing collection within the right tonsillar fossa consistent with small abscess.  Salivary glands: Negative  Thyroid: Negative  Lymph nodes: Negative  Vascular: Negative  Limited intracranial: Negative  Visualized orbits: Negative  Mastoids and visualized paranasal sinuses: Negative  Skeleton: Negative  Upper chest: No pneumonia.  Critical Value/emergent results were called by telephone at the time of interpretation on 06/25/2014 at 12:10 am to  Dr. Evelina Bucy , who verbally acknowledged these results.  IMPRESSION: 1. Supraglottitis. 2. 8 mm right tonsillar fluid collection.   Electronically Signed   By: Monte Fantasia M.D.   On: 06/25/2014 00:13     ASSESSMENT / PLAN:  PULMONARY Urgent Tracheostomy 4/06 >>  A:  Acute epiglottitis causing acute respiratory failure> now status post tracheostomy Diffuse Bilateral Pulmonary Infiltrates - Negative pressure pulmonary edema (most likely source of infiltrates) vs infectious etiology vs ALI P:   See infectious disease Ventilator support with PSV as support mode and progression to ATC as tolerated; PS should be more comfortable for pt Wean O2 for sats > 92% Trend CXR  Postoperative tracheostomy management per otolaryngology Would like to diurese given B infiltrates, will consider depending on BP and renal fxn  CARDIOVASCULAR A:  Hypotension - sedation related  P:  Telemetry monitoring NS bolus x1 now, then D5NS at 40ml/hr while NPO Assess ECHO to r/o other source hypotension (? viral cardiomyopathy)  RENAL  A:   Acute Kidney Injury - baseline sr cr ~ 1.2, suspect related to sedation-related hypotension Hyperkalemia - no evidence of hemolysis 4/6 Lactic Acidosis  P:   Monitor BMET and UOP Replace electrolytes as needed Repeat labs now:  BMP, lactic acid  Kayexalate x1 now Trend lactate   GASTROINTESTINAL A:   No acute issues P:   SLP consult once tolerating ATC NPO Panda placement for meds Pepcid for stress ulcer prophylaxis  HEMATOLOGIC A:  No acute issues P:  Monitor for bleeding  INFECTIOUS A:   Acute epiglottitis Peritonsillar Abscess - fluid collection seen on CT   B infiltrates, ? Possible aspiration PNA P:   BCx2 4/6 >>  Unasyn 4/6 >>  ENDOCRINE A:   No acute issues  P:   Monitor glucose  NEUROLOGIC A:   Postoperative pain Acute anxiety and agitation P:   RASS goal: 0 Will start propofol for sedation, lighten daily PRN fentanyl for  pain Mobilize as able   FAMILY  - Updates:  Family updated at bedside   Noe Gens, NP-C [place] Pgr: [phone] or [phone]  Attending Note:  I have examined patient, reviewed labs, studies and notes. I have discussed the case with B Ollis, and I agree with the data and plans as amended above. Pt is s/p urgent trach for UA obstruction. eval revealed epiglottitis and possible peritonsillar abscess. He experienced an period of hypotension overnight when he received sedation and pain meds. This am he has relative renal insufficiency, hypotension, hyperkalemia. His CXR shows B infiltrates and he failed a transition to ATC due to hypoxemia. I suspect he has negative pressure pulmonary edema, need to also rule out ALI, aspiration PNA, cardiogenic edema. I suspect his renal insufficiency is due to med-induced hypotension, but will follow lactate and be vigilant for possible sepsis. Hopefully will transition to ATC soon.  Independent critical care time is 40 minutes.   Baltazar Apo, MD, PhD 06/25/2014, 2:09 PM [facility] [phone] or if no answer [phone]

## 2014-06-25 NOTE — ED Notes (Addendum)
Awaiting on transport to OR.

## 2014-06-25 NOTE — Progress Notes (Signed)
Per NP- RT placed PT on 60% ATC- RN aware. Sp02 94% (goal >=92%), HR 78, RR23, RR 17- tolerating well at this time.

## 2014-06-25 NOTE — Op Note (Signed)
NAME:  Dakota Jackson, Dakota Jackson NO.:  [id number]  MEDICAL RECORD NO.:  [id number]  LOCATION:  [facility]                         FACILITY:  [facility]  PHYSICIAN:  Leta Baptist, MD            DATE OF BIRTH:  1966/02/16  DATE OF PROCEDURE:  06/25/2014 DATE OF DISCHARGE:                              OPERATIVE REPORT   SURGEON:  Leta Baptist, MD  PREOPERATIVE DIAGNOSES: 1. Epiglottitis. 2. Acute airway obstruction. 3. Right peritonsillar abscess.  POSTOPERATIVE DIAGNOSES: 1. Epiglottitis. 2. Acute airway obstruction. 3. Right peritonsillar abscess.  PROCEDURE PERFORMED:  Tracheostomy.  ANESTHESIA:  IV sedation with ketamine, propofol.  COMPLICATIONS:  None.  ESTIMATED BLOOD LOSS:  Less than 20 mL.  INDICATION FOR PROCEDURE:  The patient is a 49 year old male who presented to the [facility] Emergency Room last night complaining of increasing sore throat and difficulty breathing.  While he was in the emergency room, his airway obstruction significantly worsened.  He underwent a neck CT scan, which showed a small right peritonsillar abscess together with severely edematous epiglottis.  Attempts to intubate the patient in the emergency room was unsuccessful.  The decision was therefore made for the patient to undergo intubation in the operating room, and possible tracheostomy tube placement.  The risks, benefits, and details of the procedure were discussed with the patient and his wife.  Informed consent was obtained.  DESCRIPTION OF PROCEDURE:  The patient was taken to the operating room and placed supine on the operating table.  IV ketamine was administered. Attempts to fiberoptically intubate, the patient was unsuccessful.  The decision was, therefore, made to proceed with the tracheostomy tube placement.  The anterior neck was prepped and draped with Betadine.  Lidocaine 1% with 1:100,000 epinephrine was injected into the anterior neck. Vertical incision was made at the level  of the cricoid.  The incision was carried down to the level of the strap muscles.  The strap muscles were divided at midline and retracted laterally, exposing the thyroid isthmus.  The thyroid was ligated at midline.  An incision was made next to the second tracheal ring.  An endotracheal tube was then inserted into the trachea via the tracheal incision.  Good CO2 return was obtained.  Once good oxygenation was achieved, the endotracheal tube was then exchanged-through a tube changer with an #8 Shiley tracheostomy tube.  The tracheostomy tube was then secured with 3-0 Prolene sutures and circumferential neck ties.  Good CO2 return was again noted.  That concluded the procedure for the patient.  The care of the patient was turned over to the anesthesiologist.  OPERATIVE FINDINGS:  An #8 Shiley tracheostomy tube was placed.  SPECIMEN:  None.  FOLLOWUP CARE:  The patient will be transferred to the intensive care unit.  He will be treated for his epiglottitis and right peritonsillar abscess.     Leta Baptist, MD     ST/MEDQ  D:  06/25/2014  T:  06/25/2014  Job:  [id number]

## 2014-06-25 NOTE — Progress Notes (Signed)
RN notified RT that PT is awake and responding. Per Noe Gens, NP- RT to place PT on PS. Tolerating at this time.

## 2014-06-25 NOTE — Progress Notes (Signed)
eLink Physician-Brief Progress Note Patient Name: DARRION WYSZYNSKI DOB: 17-Aug-1965 MRN: [id number]   Date of Service  06/25/2014  HPI/Events of Note  Hypotension, bradycardia, suspect precedex related  eICU Interventions  Hold precedex Bolus 1 L NS now     Intervention Category Major Interventions: Hypotension - evaluation and management  MCQUAID, DOUGLAS 06/25/2014, 6:49 AM

## 2014-06-25 NOTE — ED Notes (Signed)
Patient to the OR via [place], Dr [name] at bedside

## 2014-06-25 NOTE — Anesthesia Postprocedure Evaluation (Addendum)
  Anesthesia Post-op Note  Patient: Dakota Jackson  Procedure(s) Performed: Procedure(s) (LRB):  attempted fiberoptic intubation (N/A) followed by tracheostomy  Patient Location: ICU  Anesthesia Type: MAC/general  Level of Consciousness: sedated   Airway and Oxygen Therapy: tracheostomy, assisted ventilation  Post-op Pain: no signs  Post-op Assessment: Post-op Vital signs reviewed, Patient's Cardiovascular Status Stable, Respiratory Function Stable, Patent Airway and No signs of Nausea or vomiting  Last Vitals:  Filed Vitals:   06/25/14 0145  BP:   Pulse: 123  Temp:   Resp: 25    Post-op Vital Signs: stable   Complications: No apparent anesthesia complications. Discussed operative course with patient's wife. Consulted Dr. Lake Bells, CCM (per Dr. [name] request) who will manage ventilator tonight.

## 2014-06-25 NOTE — ED Notes (Signed)
Patient is being suctioned and placed on a non-breather at 15L/min. Pt is being reassured. Dr. Evelina Bucy has spoken to anaesthesiologist.

## 2014-06-25 NOTE — Progress Notes (Addendum)
INITIAL NUTRITION ASSESSMENT  DOCUMENTATION CODES Per approved criteria  -Not Applicable   INTERVENTION: If diet unable to be advanced, recommend initiation of Vital AF @ 20 ml/hr via Panda Tube and increase by 10 ml every 4 hours to goal rate of 75 ml/hr.    Tube feeding regimen provides 2160 kcal (100% of needs), 135 grams of protein, and 1458 ml of H2O.   RD will continue to monitor  NUTRITION DIAGNOSIS: Inadequate oral intake related to inability to eat as evidenced by NPO.   Goal: Pt to meet >/= 90% of their estimated nutrition needs   Monitor:  Weight trend, NPO, vent status/settings, labs  Reason for Assessment: New Vent  49 y.o. male  Admitting Dx: <principal problem not specified>  ASSESSMENT: 49 year-old male admitted on 06/25/2014 with acute epiglottitis requiring emergent tracheostomy.  Patient is currently intubated on ventilator support MV: 12.3 L/min Temp (24hrs), Avg:98.6 F (37 C), Min:97.8 F (36.6 C), Max:99.7 F (37.6 C)  Propofol: none  - Pt with no signs of fat or muscle depletion. Per chart history weight is stable. - Panda tube placed today.  - SLP evaluation pending once pt tolerating ATC - RD will continue to monitor.  - Labs and medications reviewed.   K elevated 6.7  Albumin low 3.3  Height: Ht Readings from Last 1 Encounters:  06/25/14 5\' 11"  (1.803 m)    Weight: Wt Readings from Last 1 Encounters:  06/25/14 196 lb 3.4 oz (89 kg)    Ideal Body Weight: 75.3 kg  % Ideal Body Weight: 118%  Wt Readings from Last 10 Encounters:  06/25/14 196 lb 3.4 oz (89 kg)  06/23/14 186 lb (84.369 kg)  11/22/12 197 lb (89.359 kg)  06/20/12 200 lb 12 oz (91.06 kg)  05/24/12 202 lb (91.627 kg)  05/01/12 193 lb 12.8 oz (87.907 kg)  04/30/12 197 lb (89.359 kg)  04/23/12 194 lb (87.998 kg)  04/20/12 194 lb 12 oz (88.338 kg)  04/17/12 194 lb (87.998 kg)    BMI:  Body mass index is 27.38 kg/(m^2).  Estimated Nutritional Needs: Kcal:  2155 Protein: 120-135 g Fluid: >2.0 L/day  Skin: intact  Diet Order: Diet NPO time specified  EDUCATION NEEDS: -No education needs identified at this time   Intake/Output Summary (Last 24 hours) at 06/25/14 1500 Last data filed at 06/25/14 1400  Gross per 24 hour  Intake 3616.68 ml  Output    425 ml  Net 3191.68 ml    Last BM: prior to admission   Labs:   Recent Labs Lab 06/24/14 2206 06/25/14 1040  NA 140 137  K 4.0 6.7*  CL 108 109  CO2 24 18*  BUN 15 19  CREATININE 1.20 1.50*  CALCIUM 9.2 7.5*  GLUCOSE 129* 177*    CBG (last 3)  No results for input(s): GLUCAP in the last 72 hours.  Scheduled Meds: . ampicillin-sulbactam (UNASYN) IV  3 g Intravenous 4 times per day  . antiseptic oral rinse  7 mL Mouth Rinse QID  . chlorhexidine  15 mL Mouth Rinse BID  . [START ON 06/26/2014] Chlorhexidine Gluconate Cloth  6 each Topical Q0600  . famotidine (PEPCID) IV  20 mg Intravenous Q12H  . mupirocin ointment  1 application Nasal BID    Continuous Infusions: . dextrose 5 % and 0.9% NaCl 75 mL/hr at 06/25/14 1053    Past Medical History  Diagnosis Date  . Diverticulosis   . Hypertension     no meds  .  Arthritis     bilateral shoulders  . Hemorrhoids   . Tubular adenoma of colon 03/2012    Past Surgical History  Procedure Laterality Date  . Circumcision  04/17/2012    Procedure: CIRCUMCISION ADULT;  Surgeon: Hanley Ben, MD;  Location: [facility];  Service: Urology;  Laterality: N/A;    Laurette Schimke [place], [place], [place]

## 2014-06-25 NOTE — ED Notes (Signed)
Spouse at bedside. Dr. Evelina Bucy spoke with spouse. Pt and spouse are being reassured. Awaiting on ENT provider and anaesthesiologist.

## 2014-06-25 NOTE — ED Notes (Signed)
Patient's symptoms are not improved. He is still able to breath with unlabored breaths, equal. Pt is still having increased spitting.

## 2014-06-25 NOTE — ED Notes (Signed)
Updated patients spouse and reassured patients spouse. No needs from spouse.

## 2014-06-25 NOTE — Anesthesia Preprocedure Evaluation (Signed)
Anesthesia Evaluation  Patient identified by MRN, date of birth, ID band Patient awake    Reviewed: Allergy & Precautions, NPO status , Patient's Chart, lab work & pertinent test results  Airway Mallampati: II  TM Distance: >3 FB Neck ROM: Full    Dental no notable dental hx.    Pulmonary Current Smoker,  breath sounds clear to auscultation  Pulmonary exam normal       Cardiovascular hypertension, Rhythm:Regular Rate:Tachycardia     Neuro/Psych negative neurological ROS  negative psych ROS   GI/Hepatic negative GI ROS, Neg liver ROS,   Endo/Other  negative endocrine ROS  Renal/GU negative Renal ROS  negative genitourinary   Musculoskeletal  (+) Arthritis -,   Abdominal   Peds negative pediatric ROS (+)  Hematology negative hematology ROS (+)   Anesthesia Other Findings   Reproductive/Obstetrics negative OB ROS                             Anesthesia Physical Anesthesia Plan  ASA: II and emergent  Anesthesia Plan: MAC   Post-op Pain Management:    Induction: Intravenous  Airway Management Planned: Awake Intubation Planned and Tracheostomy  Additional Equipment:   Intra-op Plan:   Post-operative Plan: Post-operative intubation/ventilation  Informed Consent: I have reviewed the patients History and Physical, chart, labs and discussed the procedure including the risks, benefits and alternatives for the proposed anesthesia with the patient or authorized representative who has indicated his/her understanding and acceptance.   Dental advisory given  Plan Discussed with: CRNA and Surgeon  Anesthesia Plan Comments: (Severe epiglottitis on CT scan. CT scan reviewed. Plan with Dr. [name] to take to OR and attempt fiberoptic intubation with awake tracheostomy as backup plan. Discussed with patient and wife why he needs to be sedated and breathing on his own during procedure. General  anesthesia induction would be very dangerous.)        Anesthesia Quick Evaluation

## 2014-06-25 NOTE — Progress Notes (Signed)
Register Progress Note Patient Name: Dakota Jackson DOB: 06-24-1965 MRN: [id number]   Date of Service  06/25/2014  HPI/Events of Note    eICU Interventions  Labetalol prn high BP     Intervention Category Intermediate Interventions: Hypertension - evaluation and management  ALVA,RAKESH V. 06/25/2014, 4:03 PM

## 2014-06-25 NOTE — Progress Notes (Signed)
ANTIBIOTIC CONSULT NOTE - INITIAL  Pharmacy Consult for unasyn Indication: acute epiglotitis  No Known Allergies  Patient Measurements: Height: 5\' 11"  (180.3 cm) Weight: 196 lb 3.4 oz (89 kg) IBW/kg (Calculated) : 75.3 Adjusted Body Weight:   Vital Signs: Temp: 99.7 F (37.6 C) (04/06 0400) Temp Source: Oral (04/06 0400) BP: 92/56 mmHg (04/06 0430) Pulse Rate: 92 (04/06 0430) Intake/Output from previous day: 04/05 0701 - 04/06 0700 In: 900 [I.V.:900] Out: -  Intake/Output from this shift: Total I/O In: 900 [I.V.:900] Out: -   Labs:  Recent Labs  06/24/14 2206  WBC 15.8*  HGB 14.9  PLT 246  CREATININE 1.20   Estimated Creatinine Clearance: 79.3 mL/min (by C-G formula based on Cr of 1.2). No results for input(s): VANCOTROUGH, VANCOPEAK, VANCORANDOM, GENTTROUGH, GENTPEAK, GENTRANDOM, TOBRATROUGH, TOBRAPEAK, TOBRARND, AMIKACINPEAK, AMIKACINTROU, AMIKACIN in the last 72 hours.   Microbiology: Recent Results (from the past 720 hour(s))  Rapid strep screen     Status: None   Collection Time: 06/23/14  3:34 PM  Result Value Ref Range Status   Streptococcus, Group A Screen (Direct) NEGATIVE NEGATIVE Final    Comment: (NOTE) A Rapid Antigen test may result negative if the antigen level in the sample is below the detection level of this test. The FDA has not cleared this test as a stand-alone test therefore the rapid antigen negative result has reflexed to a Group A Strep culture.     Medical History: Past Medical History  Diagnosis Date  . Diverticulosis   . Hypertension     no meds  . Arthritis     bilateral shoulders  . Hemorrhoids   . Tubular adenoma of colon 03/2012    Medications:  Anti-infectives    Start     Dose/Rate Route Frequency Ordered Stop   06/25/14 0500  Ampicillin-Sulbactam (UNASYN) 3 g in sodium chloride 0.9 % 100 mL IVPB     3 g 100 mL/hr over 60 Minutes Intravenous 4 times per day 06/25/14 0451     06/25/14 0015  clindamycin (CLEOCIN)  IVPB 600 mg     600 mg 100 mL/hr over 30 Minutes Intravenous  Once 06/25/14 0006 06/25/14 0055     Assessment: Patient with acute epiglotitis requiring tracheostomy, now in ICU.  Renal function is good.  Goal of Therapy:  Unasyn dosed based on patient weight and renal function   Plan:  Follow up culture results  Unasyn 3gm iv q6hr  Tyler Deis, Shea Stakes [place] 06/25/2014,5:20 AM

## 2014-06-25 NOTE — Consult Note (Signed)
Reason for Consult: Epiglotitis, airway obstruction Referring Physician: Dr. Evelina Bucy  HPI:  Dakota Jackson is an 49 y.o. male who presents to the Emergency Department complaining of sore throat and trouble swallowing. His symptoms started yesterday morning around 11AM. Pt rates his pain a 10 out of 10. Pt notes he was seen at the urgent care earlier yesterday for the same problems. Pt denies fever, chills, sick contact, or taking any meds. His breathing becomes more labored. His neck CT shows severe supraglottic edema and a small right peritonsillar abscess.  Attempt to intubate the pt in the ER was unsuccessful.   Past Medical History  Diagnosis Date  . Diverticulosis   . Hypertension     no meds  . Arthritis     bilateral shoulders  . Hemorrhoids   . Tubular adenoma of colon 03/2012    Past Surgical History  Procedure Laterality Date  . Circumcision  04/17/2012    Procedure: CIRCUMCISION ADULT;  Surgeon: Hanley Ben, MD;  Location: [facility];  Service: Urology;  Laterality: N/A;    Family History  Problem Relation Age of Onset  . Lung cancer Mother   . Alcohol abuse Mother   . Hypertension Mother   . Arthritis Neg Hx   . Diabetes Neg Hx   . Drug abuse Neg Hx   . Early death Neg Hx   . Heart disease Neg Hx   . Hyperlipidemia Neg Hx   . Stroke Neg Hx   . Cancer Father     lung    Social History:  reports that he has been smoking Cigarettes.  He started smoking about 32 years ago. He has a 15 pack-year smoking history. He has never used smokeless tobacco. He reports that he drinks about 6.0 oz of alcohol per week. He reports that he does not use illicit drugs.  Allergies: No Known Allergies  Prior to Admission medications   Medication Sig Start Date End Date Taking? Authorizing Provider  ibuprofen (ADVIL,MOTRIN) 200 MG tablet Take 200-400 mg by mouth every 6 (six) hours as needed (for pain.).   Yes Historical Provider, MD    Results for  orders placed or performed during the hospital encounter of 06/24/14 (from the past 48 hour(s))  CBC     Status: Abnormal   Collection Time: 06/24/14 10:06 PM  Result Value Ref Range   WBC 15.8 (H) 4.0 - 10.5 K/uL   RBC 5.02 4.22 - 5.81 MIL/uL   Hemoglobin 14.9 13.0 - 17.0 g/dL   HCT 43.9 39.0 - 52.0 %   MCV 87.5 78.0 - 100.0 fL   MCH 29.7 26.0 - 34.0 pg   MCHC 33.9 30.0 - 36.0 g/dL   RDW 13.6 11.5 - 15.5 %   Platelets 246 150 - 400 K/uL  Basic metabolic panel     Status: Abnormal   Collection Time: 06/24/14 10:06 PM  Result Value Ref Range   Sodium 140 135 - 145 mmol/L   Potassium 4.0 3.5 - 5.1 mmol/L   Chloride 108 96 - 112 mmol/L   CO2 24 19 - 32 mmol/L   Glucose, Bld 129 (H) 70 - 99 mg/dL   BUN 15 6 - 23 mg/dL   Creatinine, Ser 1.20 0.50 - 1.35 mg/dL   Calcium 9.2 8.4 - 10.5 mg/dL   GFR calc non Af Amer 69 (L) >90 mL/min   GFR calc Af Amer 80 (L) >90 mL/min    Comment: (NOTE) The eGFR has been  calculated using the CKD EPI equation. This calculation has not been validated in all clinical situations. eGFR's persistently <90 mL/min signify possible Chronic Kidney Disease.    Anion gap 8 5 - 15    Ct Soft Tissue Neck W Contrast  06/25/2014   CLINICAL DATA:  Throat swelling. Leukocytosis and recent pharyngitis diagnosis.  EXAM: CT NECK WITH CONTRAST  TECHNIQUE: Multidetector CT imaging of the neck was performed using the standard protocol following the bolus administration of intravenous contrast.  CONTRAST:  37m OMNIPAQUE IOHEXOL 300 MG/ML  SOLN  COMPARISON:  None.  FINDINGS: Pharynx and larynx: There is marked low-density thickening of the supraglottic larynx, especially of the epiglottis and aryepiglottic folds. There is narrowing of the supraglottic larynx, but no critical stenosis at this time. The true vocal folds appear normal, as does the infraglottic airway. There is parapharyngeal edema, greater on the right, but no retropharyngeal edema or abscess. This parapharyngeal  fluid, leukocytosis, and history is more consistent with infection than angioedema. 7 mm peripherally enhancing collection within the right tonsillar fossa consistent with small abscess.  Salivary glands: Negative  Thyroid: Negative  Lymph nodes: Negative  Vascular: Negative  Limited intracranial: Negative  Visualized orbits: Negative  Mastoids and visualized paranasal sinuses: Negative  Skeleton: Negative  Upper chest: No pneumonia.  Critical Value/emergent results were called by telephone at the time of interpretation on 06/25/2014 at 12:10 am to Dr. BEvelina Bucy, who verbally acknowledged these results.  IMPRESSION: 1. Supraglottitis. 2. 8 mm right tonsillar fluid collection.   Electronically Signed   By: JMonte FantasiaM.D.   On: 06/25/2014 00:13   Review of Systems  Constitutional: Negative for fever and chills.  HENT: Positive for dental problem, sore throat and trouble swallowing.  Psychiatric/Behavioral: Negative for confusion.  All other systems reviewed and are negative.  Blood pressure 206/119, pulse 163, temperature 99.3 F (37.4 C), temperature source Oral, resp. rate 26, SpO2 100 %.  Physical Exam  Constitutional: He is oriented to person, place, and time. He appears well-developed and well-nourished. Moderate distress due to breathing difficulty.  Head: Normocephalic and atraumatic.  Right Ear: External ear normal.  Left Ear: External ear normal.  Nose: Normal mucosa, septum, and turbinates. Mouth/Throat: Posterior oropharyngeal erythema present. No significant uvula deviation. Eyes: Conjunctivae and EOM are normal. Vision grossly intact. Neck: Neck supple. No mass or lesion. Cardiovascular: Normal rate and rhythm.  Pulmonary/Chest: Effort normal. No respiratory distress.  Musculoskeletal: Normal range of motion.  Neurological: He is alert and oriented to person, place, and time.  Skin: Skin is warm and dry.  Psychiatric: He has a normal mood and affect. His behavior is  normal.   Procedure:  Flexible Fiberoptic Laryngoscopy Anesthesia: Topical oxymetazoline and lidocaine Indication: Airway obstruction. Description: Risks, benefits, and alternatives of flexible endoscopy were explained to the patient. Specific mention was made of the risk of throat numbness with difficulty swallowing, possible bleeding from the nose and mouth, and pain from the procedure.  The patient gave oral consent to proceed.  The nasal cavities were decongested and anesthetised with a combination of oxymetazoline and 4% lidocaine solution.  The flexible scope was inserted into the left nasal cavity and advanced towards the nasopharynx.  Visualized mucosa over the turbinates and septum were normal.  The nasopharynx was clear.  Oropharyngeal walls were edematous.  Hypopharynx was also has diffuse edema.  The epiglotis and the larynx are diffusely edematous.  Posterior commissure with edema and redundant mucosa.  True vocal folds  were pale yellow and symmetric but without mass or lesion.    Assessment/Plan: Acute epiglotitis/pharyngitis and small right peritonsillar abscess. Severe supraglottic edema. Plan intubation in the OR. The possibility of trach is also discussed with the patient and wife. Questions invited and answered.  Keanu Frickey,SUI W 06/25/2014, 1:31 AM

## 2014-06-25 NOTE — ED Provider Notes (Signed)
CSN: [id number]     Arrival date & time 06/24/14  2121 History   First MD Initiated Contact with Patient 06/24/14 2131     Chief Complaint  Patient presents with  . Foreign Body    throat     (Consider location/radiation/quality/duration/timing/severity/associated sxs/prior Treatment) HPI Comments: Patient here with foreign body sensation in his throat. He thinks he has a pill stuck in his throat. Seen 2 days ago, informed he had a viral infection in his throat.  Patient is a 49 y.o. male presenting with foreign body. The history is provided by the patient.  Foreign Body Location:  Swallowed Suspected object: pill. Pain quality:  Aching Pain severity:  Moderate Timing:  Constant Progression:  Worsening Chronicity:  New Worsened by:  Nothing tried Ineffective treatments:  Coughing and drinking Associated symptoms: trouble swallowing   Associated symptoms: no abdominal pain, no cough, no difficulty breathing, no drooling, no sore throat and no vomiting   Associated symptoms comment:  He is having difficulty swallowing all of his secretions Risk factors: no hx of esophageal strictures     Past Medical History  Diagnosis Date  . Diverticulosis   . Hypertension     no meds  . Arthritis     bilateral shoulders  . Hemorrhoids   . Tubular adenoma of colon 03/2012   Past Surgical History  Procedure Laterality Date  . Circumcision  04/17/2012    Procedure: CIRCUMCISION ADULT;  Surgeon: Hanley Ben, MD;  Location: [facility];  Service: Urology;  Laterality: N/A;   Family History  Problem Relation Age of Onset  . Lung cancer Mother   . Alcohol abuse Mother   . Hypertension Mother   . Arthritis Neg Hx   . Diabetes Neg Hx   . Drug abuse Neg Hx   . Early death Neg Hx   . Heart disease Neg Hx   . Hyperlipidemia Neg Hx   . Stroke Neg Hx   . Cancer Father     lung   History  Substance Use Topics  . Smoking status: Current Every Day Smoker -- 0.50  packs/day for 30 years    Types: Cigarettes    Start date: 03/19/1982  . Smokeless tobacco: Never Used  . Alcohol Use: 6.0 oz/week    10 Cans of beer per week     Comment: haven't drank in 4 weeks     Review of Systems  Constitutional: Negative for fever and chills.  HENT: Positive for trouble swallowing. Negative for drooling and sore throat.   Respiratory: Negative for cough.   Gastrointestinal: Negative for vomiting and abdominal pain.  All other systems reviewed and are negative.     Allergies  Review of patient's allergies indicates no known allergies.  Home Medications   Prior to Admission medications   Medication Sig Start Date End Date Taking? Authorizing Provider  ibuprofen (ADVIL,MOTRIN) 200 MG tablet Take 200-400 mg by mouth every 6 (six) hours as needed (for pain.).   Yes Historical Provider, MD   BP 213/107 mmHg  Pulse 101  Temp(Src) 99.3 F (37.4 C) (Oral)  Resp 16  SpO2 99% Physical Exam  Constitutional: He is oriented to person, place, and time. He appears well-developed and well-nourished. No distress.  HENT:  Head: Normocephalic and atraumatic.  Mouth/Throat: Posterior oropharyngeal edema (appears swollen in posterior pharynx) and posterior oropharyngeal erythema (uvular, no uvula shift) present. No oropharyngeal exudate or tonsillar abscesses.  Eyes: EOM are normal. Pupils are equal, round, and  reactive to light.  Neck: Normal range of motion. Neck supple.  Cardiovascular: Normal rate and regular rhythm.  Exam reveals no friction rub.   No murmur heard. Pulmonary/Chest: Effort normal and breath sounds normal. No respiratory distress. He has no wheezes. He has no rales.  Abdominal: He exhibits no distension. There is no tenderness. There is no rebound.  Musculoskeletal: Normal range of motion. He exhibits no edema.  Lymphadenopathy:    He has cervical adenopathy (R submandibular).  Neurological: He is alert and oriented to person, place, and time.   Skin: Skin is warm. No rash noted. He is not diaphoretic.  Nursing note and vitals reviewed.   ED Course  Procedures (including critical care time) Labs Review Labs Reviewed  CBC - Abnormal; Notable for the following:    WBC 15.8 (*)    All other components within normal limits  BASIC METABOLIC PANEL - Abnormal; Notable for the following:    Glucose, Bld 129 (*)    GFR calc non Af Amer 69 (*)    GFR calc Af Amer 80 (*)    All other components within normal limits    Imaging Review Ct Soft Tissue Neck W Contrast  06/25/2014   CLINICAL DATA:  Throat swelling. Leukocytosis and recent pharyngitis diagnosis.  EXAM: CT NECK WITH CONTRAST  TECHNIQUE: Multidetector CT imaging of the neck was performed using the standard protocol following the bolus administration of intravenous contrast.  CONTRAST:  95mL OMNIPAQUE IOHEXOL 300 MG/ML  SOLN  COMPARISON:  None.  FINDINGS: Pharynx and larynx: There is marked low-density thickening of the supraglottic larynx, especially of the epiglottis and aryepiglottic folds. There is narrowing of the supraglottic larynx, but no critical stenosis at this time. The true vocal folds appear normal, as does the infraglottic airway. There is parapharyngeal edema, greater on the right, but no retropharyngeal edema or abscess. This parapharyngeal fluid, leukocytosis, and history is more consistent with infection than angioedema. 7 mm peripherally enhancing collection within the right tonsillar fossa consistent with small abscess.  Salivary glands: Negative  Thyroid: Negative  Lymph nodes: Negative  Vascular: Negative  Limited intracranial: Negative  Visualized orbits: Negative  Mastoids and visualized paranasal sinuses: Negative  Skeleton: Negative  Upper chest: No pneumonia.  Critical Value/emergent results were called by telephone at the time of interpretation on 06/25/2014 at 12:10 am to Dr. Evelina Bucy , who verbally acknowledged these results.  IMPRESSION: 1. Supraglottitis.  2. 8 mm right tonsillar fluid collection.   Electronically Signed   By: Monte Fantasia M.D.   On: 06/25/2014 00:13     EKG Interpretation None       CRITICAL CARE Performed by: Osvaldo Shipper   Total critical care time: 75 minutes  Critical care time was exclusive of separately billable procedures and treating other patients.  Critical care was necessary to treat or prevent imminent or life-threatening deterioration.  Critical care was time spent personally by me on the following activities: development of treatment plan with patient and/or surrogate as well as nursing, discussions with consultants, evaluation of patient's response to treatment, examination of patient, obtaining history from patient or surrogate, ordering and performing treatments and interventions, ordering and review of laboratory studies, ordering and review of radiographic studies, pulse oximetry and re-evaluation of patient's condition.  Nasopharyngeal Scope: Indication: Possible foreign object in throat Prepped and draped in normal fashion Verbal consent given Prep: viscous lidocaine in L nare, hurricane spray in throat Introduction through L nare Findings: Bilateral cord movement,  equal. Mild epiglottic swelling and arytenoid fold swelling Patient tolerated procedure well without any immediate complications.  MDM   Final diagnoses:  Throat swelling  Supraglottitis  Supraglottitis with airway obstruction    49 year old male here with throat swelling. Seen 2 days ago for pharyngitis. Here with hoarse voice, difficulty swallowing. He feels like he has a pill stuck in his throat. On exam he has some swollen posterior pharynx, but no tonsillar swelling. Nasopharyngeal fiberoptic scope with some mild epiglottis swelling and edema around the cords but normal cords and normal movement of cords. CT scan shows supraglottitis with some airway obstruction. CT obtained about 30 minutes after the  scope. Patient was not improving. I made the decision to attempt intubation. I performed an awake intubation attempt by given the patient ketamine. On glide scope the epiglottis was extremely swollen. Eyes unable to see cords or any other surrounding structures. There were no landmarks identifiable. I abandoned intubation attempt and called ENT and anesthesia. Patient was maintaining his saturations on supplemental oxygen. No need for emergent cricothyrotomy. Dr. [name] with ENT and Dr. Gwynneth Macleod with Anesthesia graciously saw patient and planned for intubation in the OR.    Evelina Bucy, MD 06/25/14 [phone]

## 2014-06-25 NOTE — ED Notes (Signed)
Patient is being set up for possible intubation. Suction at bedside. Respiratory at bedside. Code cart at bedside with [facility] kit.

## 2014-06-25 NOTE — Progress Notes (Signed)
CRITICAL VALUE ALERT  Critical value received:  Potassium - 6.7  Date of notification: 06/25/14  Time of notification:  11:45  Critical value read back:Yes.    Nurse who received alert:  Kathie Rhodes, RN  MD notified (1st page):  Dr. Lamonte Sakai  Time of first page:  11:50  MD notified (2nd page):  Time of second page:  Responding MD:  Dr. Lamonte Sakai  Time MD responded:  11:50

## 2014-06-25 NOTE — Progress Notes (Signed)
[facility] Progress Note Patient Name: AVA DEGUIRE DOB: [birth year] MRN: [id number]   Date of Service  06/25/2014  HPI/Events of Note  Consulted from ENT and anesthesia for Mr. Bialas admitted today with severe epiglottitis requiring and emergent tracheostomy which went well. He is now in the ICU, somewhat agitated and uncomfortable  eICU Interventions  Orders placed for prn fentanyl/versed and a precedex gtt for RASS -1 until morning. Maintain on ventilator for now. On ground team to evaluate     Intervention Category Major Interventions: Change in mental status - evaluation and management;Respiratory failure - evaluation and management  Reyhan Moronta 06/25/2014, 3:48 AM

## 2014-06-26 ENCOUNTER — Inpatient Hospital Stay (HOSPITAL_COMMUNITY): Payer: BLUE CROSS/BLUE SHIELD

## 2014-06-26 ENCOUNTER — Encounter (HOSPITAL_COMMUNITY): Payer: Self-pay | Admitting: Otolaryngology

## 2014-06-26 DIAGNOSIS — J811 Chronic pulmonary edema: Secondary | ICD-10-CM | POA: Diagnosis present

## 2014-06-26 DIAGNOSIS — J810 Acute pulmonary edema: Secondary | ICD-10-CM

## 2014-06-26 LAB — CBC
HCT: 43.4 % (ref 39.0–52.0)
Hemoglobin: 14.6 g/dL (ref 13.0–17.0)
MCH: 29.9 pg (ref 26.0–34.0)
MCHC: 33.6 g/dL (ref 30.0–36.0)
MCV: 88.8 fL (ref 78.0–100.0)
PLATELETS: 187 10*3/uL (ref 150–400)
RBC: 4.89 MIL/uL (ref 4.22–5.81)
RDW: 14.1 % (ref 11.5–15.5)
WBC: 16.6 10*3/uL — AB (ref 4.0–10.5)

## 2014-06-26 LAB — BASIC METABOLIC PANEL
Anion gap: 9 (ref 5–15)
Anion gap: 11 (ref 5–15)
BUN: 18 mg/dL (ref 6–23)
BUN: 15 mg/dL (ref 6–23)
CALCIUM: 8.5 mg/dL (ref 8.4–10.5)
CALCIUM: 8.8 mg/dL (ref 8.4–10.5)
CHLORIDE: 110 mmol/L (ref 96–112)
CO2: 22 mmol/L (ref 19–32)
CO2: 26 mmol/L (ref 19–32)
CREATININE: 1.03 mg/dL (ref 0.50–1.35)
Chloride: 104 mmol/L (ref 96–112)
Creatinine, Ser: 1.09 mg/dL (ref 0.50–1.35)
GFR calc Af Amer: >90 mL/min (ref >90)
GFR calc Af Amer: >90 mL/min (ref >90)
GFR calc non Af Amer: 84 mL/min — ABNORMAL LOW (ref >90)
GFR calc non Af Amer: 78 mL/min — ABNORMAL LOW (ref >90)
GLUCOSE: 152 mg/dL — AB (ref 70–99)
Glucose, Bld: 160 mg/dL — ABNORMAL HIGH (ref 70–99)
POTASSIUM: 4.7 mmol/L (ref 3.5–5.1)
Potassium: 4.4 mmol/L (ref 3.5–5.1)
SODIUM: 141 mmol/L (ref 135–145)
Sodium: 141 mmol/L (ref 135–145)

## 2014-06-26 LAB — GLUCOSE, CAPILLARY
GLUCOSE-CAPILLARY: 159 mg/dL — AB (ref 70–99)
Glucose-Capillary: 143 mg/dL — ABNORMAL HIGH (ref 70–99)
Glucose-Capillary: 130 mg/dL — ABNORMAL HIGH (ref 70–99)

## 2014-06-26 MED ORDER — HYDRALAZINE HCL 20 MG/ML IJ SOLN
10.0000 mg | Freq: Three times a day (TID) | INTRAMUSCULAR | Status: DC
  Administered 2014-06-26: 10 mg via INTRAVENOUS
  Filled 2014-06-26: qty 1

## 2014-06-26 MED ORDER — PANCRELIPASE (LIP-PROT-AMYL) 12000-38000 UNITS PO CPEP
2.0000 | ORAL_CAPSULE | Freq: Once | ORAL | Status: AC
  Administered 2014-06-26: 24000 [IU] via ORAL
  Filled 2014-06-26: qty 2

## 2014-06-26 MED ORDER — AMLODIPINE BESYLATE 5 MG PO TABS
5.0000 mg | ORAL_TABLET | Freq: Every day | ORAL | Status: DC
  Administered 2014-06-26 – 2014-06-28 (×3): 5 mg via ORAL
  Filled 2014-06-26 (×4): qty 1

## 2014-06-26 MED ORDER — SODIUM BICARBONATE 650 MG PO TABS
650.0000 mg | ORAL_TABLET | Freq: Once | ORAL | Status: AC
  Administered 2014-06-26: 650 mg via ORAL
  Filled 2014-06-26: qty 1

## 2014-06-26 MED ORDER — LABETALOL HCL 5 MG/ML IV SOLN
10.0000 mg | INTRAVENOUS | Status: DC | PRN
  Administered 2014-06-26 (×3): 40 mg via INTRAVENOUS
  Administered 2014-06-26: 20 mg via INTRAVENOUS
  Administered 2014-06-26: 40 mg via INTRAVENOUS
  Filled 2014-06-26 (×2): qty 4
  Filled 2014-06-26: qty 8
  Filled 2014-06-26: qty 4
  Filled 2014-06-26 (×3): qty 8

## 2014-06-26 MED ORDER — HYDRALAZINE HCL 20 MG/ML IJ SOLN
20.0000 mg | Freq: Three times a day (TID) | INTRAMUSCULAR | Status: DC
  Administered 2014-06-26 – 2014-07-03 (×22): 20 mg via INTRAVENOUS
  Filled 2014-06-26 (×25): qty 1

## 2014-06-26 MED ORDER — VITAL HIGH PROTEIN PO LIQD
1000.0000 mL | ORAL | Status: DC
  Filled 2014-06-26: qty 1000

## 2014-06-26 MED ORDER — FENTANYL CITRATE 0.05 MG/ML IJ SOLN
50.0000 ug | INTRAMUSCULAR | Status: DC | PRN
  Administered 2014-06-26 – 2014-06-27 (×5): 100 ug via INTRAVENOUS
  Filled 2014-06-26 (×5): qty 2

## 2014-06-26 MED ORDER — VITAL AF 1.2 CAL PO LIQD
1000.0000 mL | ORAL | Status: DC
  Administered 2014-06-26 – 2014-06-28 (×4): 1000 mL
  Filled 2014-06-26 (×6): qty 1000

## 2014-06-26 MED ORDER — FUROSEMIDE 10 MG/ML IJ SOLN
40.0000 mg | Freq: Two times a day (BID) | INTRAMUSCULAR | Status: AC
  Administered 2014-06-26 – 2014-06-27 (×2): 40 mg via INTRAVENOUS
  Filled 2014-06-26 (×3): qty 4

## 2014-06-26 MED ORDER — LORAZEPAM 2 MG/ML IJ SOLN
2.0000 mg | Freq: Once | INTRAMUSCULAR | Status: AC
  Administered 2014-06-26: 2 mg via INTRAVENOUS
  Filled 2014-06-26: qty 1

## 2014-06-26 NOTE — Progress Notes (Signed)
Echocardiogram 2D Echocardiogram has been performed.  Dakota Jackson 06/26/2014, 3:45 PM

## 2014-06-26 NOTE — Progress Notes (Signed)
Having breakthrough epiglottitis pain despite fent 120mcg Q2h prn  Plan Increase fent dose prn (he likely cannot do pca per RN)  Dr. Brand Males, M.D., [facility].P Pulmonary and Critical Care Medicine Staff Physician [place] Pager: [phone], If no answer or between  15:00h - 7:00h: call [phone]  06/26/2014 9:40 PM

## 2014-06-26 NOTE — Progress Notes (Signed)
PULMONARY / CRITICAL CARE MEDICINE   Name: Dakota Jackson MRN: [id number] DOB: 09-30-1965    ADMISSION DATE:  06/24/2014 CONSULTATION DATE:  06/25/2014  REFERRING MD :  Dr. [name]  CHIEF COMPLAINT:  Acute epiglottitis  INITIAL PRESENTATION: 49 year-old male admitted on 06/25/2014 with acute epiglottitis requiring emergent tracheostomy.  STUDIES:  4/05  CT neck> supraglottitis noted, 8 mm right tonsillar fluid collection  SIGNIFICANT EVENTS: 4/06  emergent tracheostomy   SUBJECTIVE:  Infiltrates remain on CXR.  Tol PS 10/5 but c/o mild SOB.  Hypertensive.    VITAL SIGNS: Temp:  [97.9 F (36.6 C)-99.5 F (37.5 C)] 98.7 F (37.1 C) (04/07 0400) Pulse Rate:  [71-108] 101 (04/07 0843) Resp:  [15-30] 23 (04/07 0843) BP: (122-209)/(55-115) 188/82 mmHg (04/07 0843) SpO2:  [93 %-100 %] 100 % (04/07 0843) FiO2 (%):  [50 %] 50 % (04/07 0843)   HEMODYNAMICS:     VENTILATOR SETTINGS: Vent Mode:  [-] PSV;CPAP FiO2 (%):  [50 %] 50 % Set Rate:  [14 bmp] 14 bmp Vt Set:  [600 mL] 600 mL PEEP:  [5 cmH20] 5 cmH20 Pressure Support:  [10 cmH20] 10 cmH20 Plateau Pressure:  [28 cmH20] 28 cmH20   INTAKE / OUTPUT:  Intake/Output Summary (Last 24 hours) at 06/26/14 1017 Last data filed at 06/26/14 0800  Gross per 24 hour  Intake 2343.75 ml  Output   1825 ml  Net 518.75 ml    PHYSICAL EXAMINATION: General:  wdwn adult male in NAD  Neuro:  Awake, alert.  MAE.  Communicates by writing HEENT:  Mm pink/moist, no jvd, trach midline c/d/i (#8) Cardiovascular:  s1s2 rrr, no m/r/g  Lungs:  resp's even/non-labored, scattered rhonchi  Abdomen:  Round/soft, bsx4 active  Musculoskeletal:  No acute deformities  Skin:  Warm/dry, no edema   LABS:  CBC  Recent Labs Lab 06/24/14 2206 06/25/14 1040 06/26/14 0350  WBC 15.8* 13.5* 16.6*  HGB 14.9 13.6 14.6  HCT 43.9 40.6 43.4  PLT 246 182 187   Coag's No results for input(s): APTT, INR in the last 168 hours.   BMET  Recent  Labs Lab 06/25/14 1040 06/25/14 1650 06/26/14 0350  NA 137 141 141  K 6.7* 4.4 4.7  CL 109 110 104  CO2 18* 22 26  BUN 19 18 15   CREATININE 1.50* 1.03 1.09  GLUCOSE 177* 160* 152*   Electrolytes  Recent Labs Lab 06/25/14 1040 06/25/14 1650 06/26/14 0350  CALCIUM 7.5* 8.5 8.8   Sepsis Markers  Recent Labs Lab 06/25/14 1124 06/25/14 1650  LATICACIDVEN 4.1* 1.7     ABG No results for input(s): PHART, PCO2ART, PO2ART in the last 168 hours.   Liver Enzymes  Recent Labs Lab 06/25/14 1040  AST 43*  ALT 39  ALKPHOS 38*  BILITOT 0.9  ALBUMIN 3.3*     Cardiac Enzymes No results for input(s): TROPONINI, PROBNP in the last 168 hours.   Glucose No results for input(s): GLUCAP in the last 168 hours.  Imaging Portable Chest Xray  06/25/2014   CLINICAL DATA:  Postop trach placement.  Respiratory failure.  EXAM: PORTABLE CHEST - 1 VIEW  COMPARISON:  None.  FINDINGS: Endotracheal tube placement. Tip measures 4.5 cm above the carinal. Shallow inspiration. Normal heart size and pulmonary vascularity. Bilateral perihilar and upper lobe infiltration could represent edema or pneumonia. No blunting of costophrenic angles. No pneumothorax.  IMPRESSION: Endotracheal tube tip measures 4.5 cm above the carina. Bilateral perihilar infiltration suggesting edema or pneumonia.  Electronically Signed   By: Lucienne Capers M.D.   On: 06/25/2014 04:16   Dg Abd Portable 1v  06/25/2014   CLINICAL DATA:  Initial encounter for feeding tube placement  EXAM: PORTABLE ABDOMEN - 1 VIEW  COMPARISON:  None.  FINDINGS: Limited study in that the left abdomen has not been included on the film. However, the tip of the feeding catheter is visualized and is located in the mid stomach. The bowel gas pattern is normal.  IMPRESSION: Feeding tube tip is in the mid stomach.   Electronically Signed   By: Misty Stanley M.D.   On: 06/25/2014 13:50     ASSESSMENT / PLAN:  PULMONARY Urgent Tracheostomy 4/06 >>   A:  Acute epiglottitis causing acute respiratory failure> now status post tracheostomy Diffuse Bilateral Pulmonary Infiltrates - Negative pressure pulmonary edema (most likely source of infiltrates) vs infectious etiology vs ALI P:   See infectious disease Ventilator support with PSV and progression to ATC as tolerated; PS should be more comfortable for pt Wean O2 for sats > 92% Trend CXR  Postoperative tracheostomy management per otolaryngology Diuresis as tolerates   CARDIOVASCULAR A:  Hypotension - sedation related. Resolved.  HTN - untreated at baseline  P:  Start amlodipine 4/7 Lasix 40mg  IV x 2 doses 4/7 Assess ECHO to r/o other source hypotension (? viral cardiomyopathy)  RENAL A:   Acute Kidney Injury - Improved.  Baseline sr cr ~ 1.2 Hyperkalemia - no evidence of hemolysis 4/6 Lactic Acidosis - resolved.  P:   Monitor BMET and UOP Replace electrolytes as needed Lasix as above    GASTROINTESTINAL A:   No acute issues P:   SLP consult once tolerating ATC Start TF 4/7 Pepcid for stress ulcer prophylaxis  HEMATOLOGIC A:  No acute issues P:  Monitor for bleeding  INFECTIOUS A:   Acute epiglottitis Peritonsillar Abscess - fluid collection seen on CT   B infiltrates, ? Possible aspiration PNA P:   BCx2 4/6 >>  Unasyn 4/6 >>  Monitor upward trend wbc 4/7  ENDOCRINE A:   No acute issues  P:   Monitor glucose  NEUROLOGIC A:   Postoperative pain Acute anxiety and agitation P:   RASS goal: 0 PRN fentanyl for pain Mobilize as able   FAMILY  - Updates:  Will update wife via phone at pts request 4/7   Nickolas Madrid, NP 06/26/2014  10:17 AM Pager: [phone] or [phone]   Attending Note:  I have examined patient, reviewed labs, studies and notes. I have discussed the case with Shon Millet, and I agree with the data and plans as amended above. He continues to have B infiltrates that will limit the transition to ATC. He wakes  easily, is tolerating PSV on exam today. Suspect negative pressure pulm edema. Will attempt to diurese, treat empirically for possible aspiration PNA. Steroids for epiglottis have been completed. Independent CC time 40 minutes  Baltazar Apo, MD, PhD 06/26/2014, 2:30 PM [place] [facility] [phone] or if no answer [phone]

## 2014-06-26 NOTE — Progress Notes (Signed)
[facility] Progress Note Patient Name: Dakota Jackson DOB: [birth year] MRN: [id number]   Date of Service  06/26/2014  HPI/Events of Note  Hypertension and anxiety  eICU Interventions  Increase labetalol prn dose     Intervention Category Major Interventions: Hypertension - evaluation and management  Genette Huertas 06/26/2014, 12:00 AM

## 2014-06-26 NOTE — Progress Notes (Signed)
eLink Physician-Brief Progress Note Patient Name: Dakota Jackson DOB: 07-May-1965 MRN: [id number]   Date of Service  06/26/2014  HPI/Events of Note  Persistently hypertensive  eICU Interventions  Start scheduled hydralazine     Intervention Category Intermediate Interventions: Hypertension - evaluation and management  MCQUAID, DOUGLAS 06/26/2014, 12:24 AM

## 2014-06-26 NOTE — Progress Notes (Signed)
Per Joellen Jersey, NP CCM - RT to place PT on ATC- completed by RT Lovey Newcomer.

## 2014-06-26 NOTE — Progress Notes (Signed)
NUTRITION FOLLOW-UP  DOCUMENTATION CODES Per approved criteria  -Not Applicable   INTERVENTION: Initiate Vital AF @ 20 ml/hr via Panda Tube and increase by 10 ml every 4 hours to goal rate of 75 ml/hr.    Tube feeding regimen provides 2160 kcal (100% of needs), 135 grams of protein, and 1458 ml of H2O.    RD will continue to monitor  NUTRITION DIAGNOSIS: Inadequate oral intake related to inability to eat as evidenced by NPO, ongoing.   Goal: Pt to meet >/= 90% of their estimated nutrition needs, unmet   Monitor:  TF regimen & tolerance, respiratory status, weight, labs, I/O's  Admitting Dx: Acute epiglottitis  ASSESSMENT: 49 year-old male admitted on 06/25/2014 with acute epiglottitis requiring emergent tracheostomy.  RD consulted to initiate and manage enteral nutrition support.  Per RN, pt with clogged NGT. Once NGT unclogged, TF will be started.  Patient is currently intubated on ventilator support MV: 9.4 L/min Temp (24hrs), Avg:98.5 F (36.9 C), Min:97.9 F (36.6 C), Max:99.5 F (37.5 C)  Propofol: none  - Labs and medications reviewed.   Glucose 152  Height: Ht Readings from Last 1 Encounters:  06/25/14 5\' 11"  (1.803 m)    Weight: Wt Readings from Last 1 Encounters:  06/25/14 196 lb 3.4 oz (89 kg)    BMI:  Body mass index is 27.38 kg/(m^2).  Re-estimated Nutritional Needs: Kcal: 2048 Protein: 120-135 g Fluid: >2 L/day  Skin: intact  Diet Order: Diet NPO time specified  EDUCATION NEEDS: -No education needs identified at this time   Intake/Output Summary (Last 24 hours) at 06/26/14 1039 Last data filed at 06/26/14 1001  Gross per 24 hour  Intake 2543.75 ml  Output   2525 ml  Net  18.75 ml    Last BM: 4/6   Labs:   Recent Labs Lab 06/25/14 1040 06/25/14 1650 06/26/14 0350  NA 137 141 141  K 6.7* 4.4 4.7  CL 109 110 104  CO2 18* 22 26  BUN 19 18 15   CREATININE 1.50* 1.03 1.09  CALCIUM 7.5* 8.5 8.8  GLUCOSE 177* 160* 152*     CBG (last 3)  No results for input(s): GLUCAP in the last 72 hours.  Scheduled Meds: . amLODipine  5 mg Oral Daily  . ampicillin-sulbactam (UNASYN) IV  3 g Intravenous 4 times per day  . antiseptic oral rinse  7 mL Mouth Rinse QID  . chlorhexidine  15 mL Mouth Rinse BID  . Chlorhexidine Gluconate Cloth  6 each Topical Q0600  . famotidine (PEPCID) IV  20 mg Intravenous Q12H  . feeding supplement (VITAL HIGH PROTEIN)  1,000 mL Per Tube Q24H  . furosemide  40 mg Intravenous BID  . hydrALAZINE  20 mg Intravenous 3 times per day  . mupirocin ointment  1 application Nasal BID    Continuous Infusions:     Clayton Bibles, MS, RD, LDN Pager: [phone] After Hours Pager: [phone]

## 2014-06-26 NOTE — Progress Notes (Signed)
Subjective: Pt on vent. Sedated.  Objective: Vital signs in last 24 hours: Temp:  [97.9 F (36.6 C)-99.5 F (37.5 C)] 98.7 F (37.1 C) (04/07 0400) Pulse Rate:  [64-108] 90 (04/07 0800) Resp:  [15-31] 15 (04/07 0800) BP: (72-209)/(40-115) 188/82 mmHg (04/07 0800) SpO2:  [86 %-100 %] 100 % (04/07 0800) FiO2 (%):  [50 %-98 %] 50 % (04/07 0800)  Physical Exam  Constitutional: Pt is sedated. On vent via trach. Venting well. Head: Normocephalic and atraumatic.  Ears: Auricles and EACs normal. Nose: Normal mucosa, septum, and turbinates. Mouth/Throat: Oral mucosa edema. Neck: Neck supple. No mass or lesion. Trach in place. No bleeding. Cardiovascular: Normal rate and rhythm.  Neurological: He is sedated.  Skin: Skin is warm and dry.    Recent Labs  06/25/14 1040 06/26/14 0350  WBC 13.5* 16.6*  HGB 13.6 14.6  HCT 40.6 43.4  PLT 182 187    Recent Labs  06/25/14 1650 06/26/14 0350  NA 141 141  K 4.4 4.7  CL 110 104  CO2 22 26  GLUCOSE 160* 152*  BUN 18 15  CREATININE 1.03 1.09  CALCIUM 8.5 8.8    Medications:  I have reviewed the patient's current medications. Scheduled: . ampicillin-sulbactam (UNASYN) IV  3 g Intravenous 4 times per day  . antiseptic oral rinse  7 mL Mouth Rinse QID  . chlorhexidine  15 mL Mouth Rinse BID  . Chlorhexidine Gluconate Cloth  6 each Topical Q0600  . famotidine (PEPCID) IV  20 mg Intravenous Q12H  . hydrALAZINE  20 mg Intravenous 3 times per day  . mupirocin ointment  1 application Nasal BID   XOV:ANVBTYOMA, fentaNYL, labetalol, LORazepam  Assessment/Plan: POD #1. Acute epiglotitis/pharyngitis and small right peritonsillar abscess. Severe supraglottic edema. Could not be intubated in the ER or the OR. Emergent trach placed yesterday AM. Possible pneumonia on CXR. On Unasyn. Wean vent as tolerated.   LOS: 1 day   Kimmarie Pascale,SUI W 06/26/2014, 8:52 AM

## 2014-06-26 NOTE — Progress Notes (Signed)
eLink Physician-Brief Progress Note Patient Name: KAILASH HINZE DOB: 05/02/1965 MRN: [id number]   Date of Service  06/26/2014  HPI/Events of Note  Increased anxiety  eICU Interventions  Extra dose ativan     Intervention Category Minor Interventions: Agitation / anxiety - evaluation and management  Blessed Cotham 06/26/2014, 1:42 AM

## 2014-06-27 ENCOUNTER — Inpatient Hospital Stay (HOSPITAL_COMMUNITY): Payer: BLUE CROSS/BLUE SHIELD

## 2014-06-27 LAB — GLUCOSE, CAPILLARY
GLUCOSE-CAPILLARY: 146 mg/dL — AB (ref 70–99)
Glucose-Capillary: 145 mg/dL — ABNORMAL HIGH (ref 70–99)
Glucose-Capillary: 187 mg/dL — ABNORMAL HIGH (ref 70–99)
Glucose-Capillary: 180 mg/dL — ABNORMAL HIGH (ref 70–99)

## 2014-06-27 LAB — BASIC METABOLIC PANEL
Anion gap: 10 (ref 5–15)
BUN: 19 mg/dL (ref 6–23)
CO2: 26 mmol/L (ref 19–32)
CREATININE: 0.99 mg/dL (ref 0.50–1.35)
Calcium: 8.7 mg/dL (ref 8.4–10.5)
Chloride: 101 mmol/L (ref 96–112)
GFR calc Af Amer: >90 mL/min (ref >90)
GFR calc non Af Amer: >90 mL/min (ref >90)
Glucose, Bld: 159 mg/dL — ABNORMAL HIGH (ref 70–99)
Potassium: 3.4 mmol/L — ABNORMAL LOW (ref 3.5–5.1)
Sodium: 137 mmol/L (ref 135–145)

## 2014-06-27 LAB — CBC
HEMATOCRIT: 42.3 % (ref 39.0–52.0)
HEMOGLOBIN: 14.2 g/dL (ref 13.0–17.0)
MCH: 29.5 pg (ref 26.0–34.0)
MCHC: 33.6 g/dL (ref 30.0–36.0)
MCV: 87.9 fL (ref 78.0–100.0)
Platelets: 201 10*3/uL (ref 150–400)
RBC: 4.81 MIL/uL (ref 4.22–5.81)
RDW: 13.8 % (ref 11.5–15.5)
WBC: 11.6 10*3/uL — ABNORMAL HIGH (ref 4.0–10.5)

## 2014-06-27 MED ORDER — IBUPROFEN 100 MG/5ML PO SUSP
400.0000 mg | Freq: Four times a day (QID) | ORAL | Status: DC | PRN
Start: 2014-06-27 — End: 2014-06-28
  Administered 2014-06-27: 400 mg via ORAL
  Filled 2014-06-27 (×4): qty 20

## 2014-06-27 MED ORDER — FUROSEMIDE 10 MG/ML IJ SOLN
40.0000 mg | Freq: Two times a day (BID) | INTRAMUSCULAR | Status: DC
Start: 2014-06-27 — End: 2014-06-28
  Administered 2014-06-27 – 2014-06-28 (×2): 40 mg via INTRAVENOUS
  Filled 2014-06-27 (×4): qty 4

## 2014-06-27 MED ORDER — ONDANSETRON HCL 4 MG/2ML IJ SOLN
INTRAMUSCULAR | Status: AC
  Filled 2014-06-27: qty 4

## 2014-06-27 MED ORDER — ONDANSETRON HCL 4 MG/2ML IJ SOLN
4.0000 mg | Freq: Three times a day (TID) | INTRAMUSCULAR | Status: DC | PRN
  Administered 2014-06-27: 8 mg via INTRAVENOUS

## 2014-06-27 MED ORDER — POTASSIUM CHLORIDE 10 MEQ/100ML IV SOLN
10.0000 meq | INTRAVENOUS | Status: AC
  Administered 2014-06-27 (×4): 10 meq via INTRAVENOUS
  Filled 2014-06-27 (×4): qty 100

## 2014-06-27 NOTE — Progress Notes (Signed)
Trach WNL at this time- RT changed inner cannula, RT suction PT, resulted in small, bloody sputum. PT remains on 35% ATC.

## 2014-06-27 NOTE — Progress Notes (Signed)
RT placed PT on new ATC set up- uneventful.

## 2014-06-27 NOTE — Progress Notes (Signed)
NUTRITION FOLLOW-UP  INTERVENTION: Initiate Vital AF @ 20 ml/hr via NGT and increase by 10 ml every 4 hours to goal rate of 75 ml/hr.    Tube feeding regimen provides 2160 kcal (100% of needs), 135 grams of protein, and 1458 ml of H2O.    NUTRITION DIAGNOSIS: Inadequate oral intake related to inability to eat as evidenced by NPO, ongoing.   Goal: Pt to meet >/= 90% of their estimated nutrition needs, unmet   Monitor:  TF regimen & tolerance, respiratory status, weight, labs, I/O's  ASSESSMENT: Pt admitted on 4/6 with acute epiglottitis requiring emergent tracheostomy.  Pt has transitioned to trach collar.  NGT (tip in mid stomach) SLP eval if pt tolerates.  Potassium low.   Pt off of vent support.  Per RN tolerating TF well. Pt up in bed visiting with family. Vital AF 1.2 @ 70 ml/hr and is advancing to goal rate of 75 ml/hr.   Height: Ht Readings from Last 1 Encounters:  06/25/14 5\' 11"  (1.803 m)    Weight: Wt Readings from Last 1 Encounters:  06/27/14 196 lb 10.4 oz (89.2 kg)    BMI:  Body mass index is 27.44 kg/(m^2).  Re-estimated Nutritional Needs: Kcal: 2100-2300 Protein: 115-125 g Fluid: >2 L/day  Skin: intact  Diet Order: Diet NPO time specified   Intake/Output Summary (Last 24 hours) at 06/27/14 1245 Last data filed at 06/27/14 1123  Gross per 24 hour  Intake 1545.83 ml  Output   3250 ml  Net -1704.17 ml    Last BM: 4/7   Labs:   Recent Labs Lab 06/25/14 1650 06/26/14 0350 06/27/14 0325  NA 141 141 137  K 4.4 4.7 3.4*  CL 110 104 101  CO2 22 26 26   BUN 18 15 19   CREATININE 1.03 1.09 0.99  CALCIUM 8.5 8.8 8.7  GLUCOSE 160* 152* 159*    CBG (last 3)   Recent Labs  06/27/14 0322 06/27/14 0806 06/27/14 1202  GLUCAP 145* 187* 146*    Scheduled Meds: . amLODipine  5 mg Oral Daily  . ampicillin-sulbactam (UNASYN) IV  3 g Intravenous 4 times per day  . antiseptic oral rinse  7 mL Mouth Rinse QID  . chlorhexidine  15 mL Mouth  Rinse BID  . Chlorhexidine Gluconate Cloth  6 each Topical Q0600  . famotidine (PEPCID) IV  20 mg Intravenous Q12H  . furosemide  40 mg Intravenous BID  . hydrALAZINE  20 mg Intravenous 3 times per day  . mupirocin ointment  1 application Nasal BID  . potassium chloride  10 mEq Intravenous Q1 Hr x 4    Continuous Infusions: . feeding supplement (VITAL AF 1.2 CAL) 1,000 mL (06/27/14 1156)     Anthem, Vera, CNSC [phone] Pager [phone] After Hours Pager

## 2014-06-27 NOTE — Progress Notes (Signed)
ANTIBIOTIC CONSULT NOTE  Pharmacy Consult for unasyn Indication: acute epiglotitis  No Known Allergies  Patient Measurements: Height: 5\' 11"  (180.3 cm) Weight: 196 lb 10.4 oz (89.2 kg) IBW/kg (Calculated) : 75.3  Vital Signs: Temp: 98.5 F (36.9 C) (04/08 0800) Temp Source: Oral (04/08 0800) BP: 112/64 mmHg (04/08 1100) Pulse Rate: 95 (04/08 1100) Intake/Output from previous day: 04/07 0701 - 04/08 0700 In: 1595.8 [I.V.:300; NG/GT:765.8; IV Piggyback:450] Out: 3310 [Urine:3310] Intake/Output from this shift: Total I/O In: 180 [NG/GT:180] Out: 300 [Urine:300]  Labs:  Recent Labs  06/25/14 1040 06/25/14 1650 06/26/14 0350 06/27/14 0325  WBC 13.5*  --  16.6* 11.6*  HGB 13.6  --  14.6 14.2  PLT 182  --  187 201  CREATININE 1.50* 1.03 1.09 0.99   Estimated Creatinine Clearance: 96.1 mL/min (by C-G formula based on Cr of 0.99). No results for input(s): VANCOTROUGH, VANCOPEAK, VANCORANDOM, GENTTROUGH, GENTPEAK, GENTRANDOM, TOBRATROUGH, TOBRAPEAK, TOBRARND, AMIKACINPEAK, AMIKACINTROU, AMIKACIN in the last 72 hours.   Microbiology: Recent Results (from the past 720 hour(s))  Rapid strep screen     Status: None   Collection Time: 06/23/14  3:34 PM  Result Value Ref Range Status   Streptococcus, Group A Screen (Direct) NEGATIVE NEGATIVE Final    Comment: (NOTE) A Rapid Antigen test may result negative if the antigen level in the sample is below the detection level of this test. The FDA has not cleared this test as a stand-alone test therefore the rapid antigen negative result has reflexed to a Group A Strep culture.   Culture, Group A Strep     Status: None   Collection Time: 06/23/14  3:34 PM  Result Value Ref Range Status   Strep A Culture Negative  Final    Comment: (NOTE) Performed At: [facility] [place], [place] [id number] Lindon Romp MD [id number]   MRSA PCR Screening     Status: Abnormal   Collection Time: 06/25/14  4:08 AM   Result Value Ref Range Status   MRSA by PCR POSITIVE (A) NEGATIVE Final    Comment:        The GeneXpert MRSA Assay (FDA approved for NASAL specimens only), is one component of a comprehensive MRSA colonization surveillance program. It is not intended to diagnose MRSA infection nor to guide or monitor treatment for MRSA infections. RESULT CALLED TO, READ BACK BY AND VERIFIED WITH: S. EARLY RN AT 0555 ON 04.06.16 BY SHUEA   Culture, blood (routine x 2)     Status: None (Preliminary result)   Collection Time: 06/25/14  6:15 AM  Result Value Ref Range Status   Specimen Description BLOOD RIGHT HAND  Final   Special Requests BOTTLES DRAWN AEROBIC ONLY 6CC  Final   Culture   Final           BLOOD CULTURE RECEIVED NO GROWTH TO DATE CULTURE WILL BE HELD FOR 5 DAYS BEFORE ISSUING A FINAL NEGATIVE REPORT Performed at [facility]    Report Status PENDING  Incomplete  Culture, blood (routine x 2)     Status: None (Preliminary result)   Collection Time: 06/25/14  6:25 AM  Result Value Ref Range Status   Specimen Description BLOOD LEFT HAND  Final   Special Requests BOTTLES DRAWN AEROBIC AND ANAEROBIC 4CC  Final   Culture   Final           BLOOD CULTURE RECEIVED NO GROWTH TO DATE CULTURE WILL BE HELD FOR 5 DAYS BEFORE ISSUING  A FINAL NEGATIVE REPORT Performed at [facility]    Report Status PENDING  Incomplete    Medical History: Past Medical History  Diagnosis Date  . Diverticulosis   . Hypertension     no meds  . Arthritis     bilateral shoulders  . Hemorrhoids   . Tubular adenoma of colon 03/2012    Medications:  Anti-infectives    Start     Dose/Rate Route Frequency Ordered Stop   06/25/14 0500  Ampicillin-Sulbactam (UNASYN) 3 g in sodium chloride 0.9 % 100 mL IVPB     3 g 100 mL/hr over 60 Minutes Intravenous 4 times per day 06/25/14 0451     06/25/14 0015  clindamycin (CLEOCIN) IVPB 600 mg     600 mg 100 mL/hr over 30 Minutes Intravenous  Once  06/25/14 0006 06/25/14 0055     Assessment 86 YOM presented to Urgent care, then [facility] ED on 4/4 dt biting his tongue, trouble breathing/swallowing, extreme pain in head/neck>> [facility] ED on 4/5 dt sore throat & dental pain>>viral pharyngitis>>4/6 [facility] dt epiglotits w/ airway obstruction & peritonsillar abscess>>failed intubated x4>>cricothyrotomy preformed>>trached in OR  PMH: arthritis, tubular adenoma of colon, current smoker, alcohol use  4/6 >> unasyn >>   Temp: 101.5 overnight  WBC: sl elevated; overall trending down (given decadron)  Renal: normal function   4/6 bloodx2: ngtd  4/4 GAS>>neg  4/6 MRSA PCR>>pos   Level/dose changes:  4/6 Unasyn dosing based on CrCl >50>>3g q6h  4/7 slight improvement in renal fxn>>continue Abx dose   Goal of Therapy:   Eradication of infection  Appropriate antibiotic dosing for indication and renal function  Plan:  Day 3 of antibiotics   Continue Unasyn 3 g IV q6 hr  Follow clinical course, renal function, culture results as available  Follow for de-escalation of antibiotics and LOT   Reuel Boom, PharmD Pager: [phone] 06/27/2014, 11:54 AM

## 2014-06-27 NOTE — Consult Note (Signed)
Subjective: Pt awake and alert. No breathing difficulty.  Objective: Vital signs in last 24 hours: Temp:  [98.5 F (36.9 C)-101.5 F (38.6 C)] 98.7 F (37.1 C) (04/08 1200) Pulse Rate:  [86-110] 95 (04/08 1100) Resp:  [14-30] 27 (04/08 1100) BP: (111-187)/(45-109) 112/64 mmHg (04/08 1100) SpO2:  [96 %-100 %] 98 % (04/08 1100) FiO2 (%):  [40 %] 40 % (04/08 1100) Weight:  [89.2 kg (196 lb 10.4 oz)] 89.2 kg (196 lb 10.4 oz) (04/08 0400)  Physical Exam  Constitutional: Pt is awake and alert. Head: Normocephalic and atraumatic.  Ears: Auricles and EACs normal. Nose: Normal mucosa, septum, and turbinates. Mouth/Throat: Oral mucosa edema. Neck: Neck supple. No mass or lesion. Trach in place. No bleeding. Cardiovascular: Normal rate and rhythm.  Skin: Skin is warm and dry.    Recent Labs  06/26/14 0350 06/27/14 0325  WBC 16.6* 11.6*  HGB 14.6 14.2  HCT 43.4 42.3  PLT 187 201    Recent Labs  06/26/14 0350 06/27/14 0325  NA 141 137  K 4.7 3.4*  CL 104 101  CO2 26 26  GLUCOSE 152* 159*  BUN 15 19  CREATININE 1.09 0.99  CALCIUM 8.8 8.7    Medications:  I have reviewed the patient's current medications. Scheduled: . amLODipine  5 mg Oral Daily  . ampicillin-sulbactam (UNASYN) IV  3 g Intravenous 4 times per day  . antiseptic oral rinse  7 mL Mouth Rinse QID  . chlorhexidine  15 mL Mouth Rinse BID  . Chlorhexidine Gluconate Cloth  6 each Topical Q0600  . famotidine (PEPCID) IV  20 mg Intravenous Q12H  . furosemide  40 mg Intravenous BID  . hydrALAZINE  20 mg Intravenous 3 times per day  . mupirocin ointment  1 application Nasal BID  . potassium chloride  10 mEq Intravenous Q1 Hr x 4   KAJ:GOTLXBWIO, fentaNYL, ibuprofen, labetalol, LORazepam  Assessment/Plan: POD #2. Acute epiglotitis/pharyngitis and small right peritonsillar abscess. Severe supraglottic edema. Could not be intubated in the ER or the OR. Emergent trach placed 2 days ago. Possible pneumonia on  CXR. On Unasyn.   - Once pt is weaned off vent, will repeat laryngoscopy at bedside to determine his epiglottic swelling prior to decannulation. - Continue IV abx.   LOS: 2 days   Dakota Jackson,SUI W 06/27/2014, 1:13 PM

## 2014-06-27 NOTE — Accreditation Note (Signed)
Patient is Dakota Jackson and is able to follow commands. He has had no complaints of pain during the shift. E-link MD, Lu Duffel was called and notified this afternoon that patient had an one episode of vomiting this afternoon with oral temperature of 100.4. New orders were written. Also notified E-link MD that patient's heart rate had increased to 120-130's non-sustained when suctioned and blood pressure was elevated to 794'I systolic. No new orders written was told to continue to monitor.

## 2014-06-27 NOTE — Progress Notes (Signed)
PULMONARY / CRITICAL CARE MEDICINE   Name: Dakota CIOLEK MRN: [id number] DOB: 1966/02/16    ADMISSION DATE:  06/24/2014 CONSULTATION DATE:  06/25/2014  REFERRING MD :  Dr. [name]  CHIEF COMPLAINT:  Acute epiglottitis  INITIAL PRESENTATION: 49 year-old male admitted on 06/25/2014 with acute epiglottitis requiring emergent tracheostomy.  STUDIES:  4/05  CT neck> supraglottitis noted, 8 mm right tonsillar fluid collection  SIGNIFICANT EVENTS: 4/06  emergent tracheostomy   SUBJECTIVE:   Has been on ATC since 11:00 on 4/7 Had some throat and trach pain o/n  VITAL SIGNS: Temp:  [98.5 F (36.9 C)-101.5 F (38.6 C)] 98.5 F (36.9 C) (04/08 0800) Pulse Rate:  [83-110] 88 (04/08 0800) Resp:  [16-30] 16 (04/08 0800) BP: (111-205)/(45-93) 144/58 mmHg (04/08 0800) SpO2:  [95 %-100 %] 97 % (04/08 0800) FiO2 (%):  [40 %] 40 % (04/08 0800) Weight:  [89.2 kg (196 lb 10.4 oz)] 89.2 kg (196 lb 10.4 oz) (04/08 0400)   HEMODYNAMICS:     VENTILATOR SETTINGS: Vent Mode:  [-]  FiO2 (%):  [40 %] 40 %   INTAKE / OUTPUT:  Intake/Output Summary (Last 24 hours) at 06/27/14 0933 Last data filed at 06/27/14 0800  Gross per 24 hour  Intake 1465.83 ml  Output   2960 ml  Net -1494.17 ml    PHYSICAL EXAMINATION: General:  wdwn adult male in NAD  Neuro:  Awake, alert.  MAE.  Communicates by writing HEENT:  Mm pink/moist, no jvd, trach midline c/d/i (#8) Cardiovascular:  s1s2 rrr, no m/r/g  Lungs:  resp's even/non-labored, scattered rhonchi  Abdomen:  Round/soft, bsx4 active  Musculoskeletal:  No acute deformities  Skin:  Warm/dry, no edema   LABS:  CBC  Recent Labs Lab 06/25/14 1040 06/26/14 0350 06/27/14 0325  WBC 13.5* 16.6* 11.6*  HGB 13.6 14.6 14.2  HCT 40.6 43.4 42.3  PLT 182 187 201   Coag's No results for input(s): APTT, INR in the last 168 hours.   BMET  Recent Labs Lab 06/25/14 1650 06/26/14 0350 06/27/14 0325  NA 141 141 137  K 4.4 4.7 3.4*  CL 110 104  101  CO2 22 26 26   BUN 18 15 19   CREATININE 1.03 1.09 0.99  GLUCOSE 160* 152* 159*   Electrolytes  Recent Labs Lab 06/25/14 1650 06/26/14 0350 06/27/14 0325  CALCIUM 8.5 8.8 8.7   Sepsis Markers  Recent Labs Lab 06/25/14 1124 06/25/14 1650  LATICACIDVEN 4.1* 1.7     ABG No results for input(s): PHART, PCO2ART, PO2ART in the last 168 hours.   Liver Enzymes  Recent Labs Lab 06/25/14 1040  AST 43*  ALT 39  ALKPHOS 38*  BILITOT 0.9  ALBUMIN 3.3*     Cardiac Enzymes No results for input(s): TROPONINI, PROBNP in the last 168 hours.   Glucose  Recent Labs Lab 06/26/14 1713 06/26/14 1942 06/26/14 2329 06/27/14 0322  GLUCAP 143* 130* 159* 145*    Imaging Dg Chest Port 1 View  06/26/2014   CLINICAL DATA:  Respiratory failure.  EXAM: PORTABLE CHEST - 1 VIEW  COMPARISON:  06/25/2014.  FINDINGS: Tracheostomy tube in stable position. Interval placement of feeding tube, is tip is projected over the stomach. Diffuse bilateral pulmonary infiltrates are again noted. These are particularly prominent over the upper lobes. Stable cardiomegaly. No pleural effusion or pneumothorax.  IMPRESSION: 1. Interim placement of feeding tube, its tip is in the stomach. Tracheostomy tube in stable position 2. Persistent bilateral pulmonary infiltrates, protrudes in the  upper lobes. No interim change. 3. Stable cardiomegaly.   Electronically Signed   By: Marcello Moores  Register   On: 06/26/2014 07:32     ASSESSMENT / PLAN:  PULMONARY Urgent Tracheostomy 4/06 >>  A:  Acute epiglottitis causing acute respiratory failure> now status post tracheostomy Diffuse Bilateral Pulmonary Infiltrates - Negative pressure pulmonary edema (most likely source of infiltrates) vs diastolic dysfxn,  infectious etiology vs ALI P:   See infectious disease section Continue ATC, should be able to tolerate Wean O2 for sats > 92% Trend CXR (continues to have infiltrates 4/8) Postoperative tracheostomy management per  otolaryngology Diuresis as tolerates > repeat lasix 4/8 Will assess for cuff down. If he can move air then consult SLP for PMV placement May need an ENT upper airway eval before we consider decannulation   CARDIOVASCULAR A:  Hypotension - sedation related. Resolved.  HTN - untreated at baseline  Intact LV systolic fxn, Grade 1 chronic diastolic dysfunction, mild RV dilation (TTE 4/7) P:  Start amlodipine 4/7, uptitrate as indicated Lasix 40mg  IV x 2 doses again on 4/8  RENAL A:   Acute Kidney Injury - Improved.  Baseline sr cr ~ 1.2 Hyperkalemia - no evidence of hemolysis 4/6, resolved Hypokalemia 4/8 Lactic Acidosis - resolved.  P:   Monitor BMET and UOP Replace electrolytes as needed Lasix as above   GASTROINTESTINAL A:   No acute issues P:   SLP consult if tolerates cuff down Started TF 4/7 Pepcid for stress ulcer prophylaxis  HEMATOLOGIC A:  No acute issues P:  Monitor for bleeding  INFECTIOUS A:   Acute epiglottitis Peritonsillar Abscess - fluid collection seen on CT   B infiltrates, ? Possible aspiration PNA P:   BCx2 4/6 >>  Unasyn 4/6 >>  Follow for cough, fever, WBC  ENDOCRINE A:   No acute issues  P:   Monitor glucose  NEUROLOGIC A:   Postoperative pain Acute anxiety and agitation P:   RASS goal: 0 PRN fentanyl for pain Mobilize as able   FAMILY  - Updates:  Updated wife 4/7   Independent CC time 35 minutes  Baltazar Apo, MD, PhD 06/27/2014, 9:33 AM [facility] [phone] or if no answer [phone]

## 2014-06-27 NOTE — Progress Notes (Signed)
eLink Physician-Brief Progress Note Patient Name: HEINZ ECKERT DOB: 1965-07-05 MRN: [id number]   Date of Service  06/27/2014  HPI/Events of Note  Mild trach site pain  eICU Interventions  Ibuprofen prn     Intervention Category Intermediate Interventions: Pain - evaluation and management  Maryssa Giampietro 06/27/2014, 3:44 AM

## 2014-06-27 NOTE — Progress Notes (Signed)
RT decreased Fi02 to 35% with 8 lpm 02- RN aware. Tolerating well at this time.

## 2014-06-28 ENCOUNTER — Inpatient Hospital Stay (HOSPITAL_COMMUNITY): Payer: BLUE CROSS/BLUE SHIELD

## 2014-06-28 DIAGNOSIS — Z930 Tracheostomy status: Secondary | ICD-10-CM

## 2014-06-28 DIAGNOSIS — R070 Pain in throat: Secondary | ICD-10-CM | POA: Diagnosis present

## 2014-06-28 LAB — GLUCOSE, CAPILLARY
GLUCOSE-CAPILLARY: 173 mg/dL — AB (ref 70–99)
GLUCOSE-CAPILLARY: 183 mg/dL — AB (ref 70–99)
GLUCOSE-CAPILLARY: 155 mg/dL — AB (ref 70–99)
Glucose-Capillary: 130 mg/dL — ABNORMAL HIGH (ref 70–99)
Glucose-Capillary: 154 mg/dL — ABNORMAL HIGH (ref 70–99)
Glucose-Capillary: 168 mg/dL — ABNORMAL HIGH (ref 70–99)
Glucose-Capillary: 174 mg/dL — ABNORMAL HIGH (ref 70–99)

## 2014-06-28 LAB — CBC
HEMATOCRIT: 43.5 % (ref 39.0–52.0)
HEMOGLOBIN: 14.5 g/dL (ref 13.0–17.0)
MCH: 29.3 pg (ref 26.0–34.0)
MCHC: 33.3 g/dL (ref 30.0–36.0)
MCV: 87.9 fL (ref 78.0–100.0)
PLATELETS: 240 10*3/uL (ref 150–400)
RBC: 4.95 MIL/uL (ref 4.22–5.81)
RDW: 13.8 % (ref 11.5–15.5)
WBC: 9.2 10*3/uL (ref 4.0–10.5)

## 2014-06-28 LAB — BASIC METABOLIC PANEL
ANION GAP: 11 (ref 5–15)
BUN: 26 mg/dL — ABNORMAL HIGH (ref 6–23)
CO2: 28 mmol/L (ref 19–32)
CREATININE: 1.32 mg/dL (ref 0.50–1.35)
Calcium: 8.7 mg/dL (ref 8.4–10.5)
Chloride: 101 mmol/L (ref 96–112)
GFR calc Af Amer: 72 mL/min — ABNORMAL LOW (ref >90)
GFR, EST NON AFRICAN AMERICAN: 62 mL/min — AB (ref >90)
GLUCOSE: 178 mg/dL — AB (ref 70–99)
POTASSIUM: 3.3 mmol/L — AB (ref 3.5–5.1)
SODIUM: 140 mmol/L (ref 135–145)

## 2014-06-28 LAB — PHOSPHORUS: Phosphorus: 4.2 mg/dL (ref 2.3–4.6)

## 2014-06-28 LAB — MAGNESIUM: MAGNESIUM: 2 mg/dL (ref 1.5–2.5)

## 2014-06-28 MED ORDER — ONDANSETRON HCL 4 MG/2ML IJ SOLN: 4.0000 mg | Freq: Four times a day (QID) | INTRAMUSCULAR | Status: DC | PRN

## 2014-06-28 MED ORDER — SODIUM CHLORIDE 0.9 % IJ SOLN: 9.0000 mL | INTRAMUSCULAR | Status: DC | PRN

## 2014-06-28 MED ORDER — HYDROMORPHONE 0.3 MG/ML IV SOLN
INTRAVENOUS | Status: DC
  Administered 2014-06-28: 13:00:00 via INTRAVENOUS
  Administered 2014-06-28 – 2014-06-29 (×4): 0.3 mg via INTRAVENOUS
  Administered 2014-06-29: 0.6 mg via INTRAVENOUS
  Administered 2014-06-30 (×2): 0.3 mg via INTRAVENOUS
  Filled 2014-06-28: qty 25

## 2014-06-28 MED ORDER — DIPHENHYDRAMINE HCL 12.5 MG/5ML PO ELIX: 12.5000 mg | ORAL_SOLUTION | Freq: Four times a day (QID) | ORAL | Status: DC | PRN

## 2014-06-28 MED ORDER — POTASSIUM CHLORIDE 20 MEQ/15ML (10%) PO SOLN
40.0000 meq | Freq: Once | ORAL | Status: DC
  Filled 2014-06-28: qty 30

## 2014-06-28 MED ORDER — POTASSIUM CHLORIDE 10 MEQ/100ML IV SOLN
10.0000 meq | INTRAVENOUS | Status: AC
  Administered 2014-06-28 (×6): 10 meq via INTRAVENOUS
  Filled 2014-06-28 (×5): qty 100

## 2014-06-28 MED ORDER — NALOXONE HCL 0.4 MG/ML IJ SOLN: 0.4000 mg | INTRAMUSCULAR | Status: DC | PRN

## 2014-06-28 MED ORDER — POTASSIUM CHLORIDE 10 MEQ/100ML IV SOLN
INTRAVENOUS | Status: AC
  Filled 2014-06-28: qty 100

## 2014-06-28 MED ORDER — DIPHENHYDRAMINE HCL 50 MG/ML IJ SOLN: 12.5000 mg | Freq: Four times a day (QID) | INTRAMUSCULAR | Status: DC | PRN

## 2014-06-28 MED ORDER — INSULIN ASPART 100 UNIT/ML ~~LOC~~ SOLN
0.0000 [IU] | Freq: Three times a day (TID) | SUBCUTANEOUS | Status: DC
  Administered 2014-06-28: 2 [IU] via SUBCUTANEOUS
  Administered 2014-06-28: 3 [IU] via SUBCUTANEOUS
  Administered 2014-06-29 (×2): 2 [IU] via SUBCUTANEOUS

## 2014-06-28 MED ORDER — FUROSEMIDE 10 MG/ML IJ SOLN
20.0000 mg | Freq: Two times a day (BID) | INTRAMUSCULAR | Status: DC
  Administered 2014-06-28: 20 mg via INTRAVENOUS
  Filled 2014-06-28 (×4): qty 2

## 2014-06-28 NOTE — Progress Notes (Signed)
Unable to do EtCO2 monitoring with PCA on this pt d/t trach. MD Ramaswamy notified, verbal orders received to continue PCA and continue continuous pulse ox monitoring without EtCO2 monitoring. Pt educated regarding PCA use for pain management. Instructed pt that he should be the only one to use the pain button and explained to him that we will monitor him closely for any signs of oversedation d/t PCA use. Currently, pt rates pain as 1/10 and has not used PCA. Will plan to transfer pt to floor.   Dorrene German, RN

## 2014-06-28 NOTE — Progress Notes (Signed)
[facility] Progress Note Patient Name: Dakota Jackson DOB: 1965-10-25 MRN: [id number]   Date of Service  06/28/2014  HPI/Events of Note  hypokalemia  eICU Interventions  Potassium replaced     Intervention Category Intermediate Interventions: Electrolyte abnormality - evaluation and management  DETERDING,ELIZABETH 06/28/2014, 6:39 AM

## 2014-06-28 NOTE — Progress Notes (Signed)
Subjective: Pt awake and responsive. Off vent for 2 days.  Objective: Vital signs in last 24 hours: Temp:  [98.6 F (37 C)-100.3 F (37.9 C)] 99.4 F (37.4 C) (04/09 1130) Pulse Rate:  [84-125] 84 (04/09 1300) Resp:  [14-36] 17 (04/09 1300) BP: (103-179)/(44-85) 123/84 mmHg (04/09 1300) SpO2:  [90 %-98 %] 96 % (04/09 1300) FiO2 (%):  [28 %-35 %] 28 % (04/09 1237) Weight:  [89.2 kg (196 lb 10.4 oz)] 89.2 kg (196 lb 10.4 oz) (04/09 0433)  Physical Exam  Constitutional: Pt is awake and alert. Head: Normocephalic and atraumatic.  Ears: Auricles and EACs normal. Nose: Normal mucosa, septum, and turbinates. Mouth/Throat: Oral mucosa edema. Neck: Neck supple. No mass or lesion. Trach in place. No bleeding. Cardiovascular: Normal rate and rhythm.  Skin: Skin is warm and dry.    Recent Labs  06/27/14 0325 06/28/14 0400  WBC 11.6* 9.2  HGB 14.2 14.5  HCT 42.3 43.5  PLT 201 240    Recent Labs  06/27/14 0325 06/28/14 0400  NA 137 140  K 3.4* 3.3*  CL 101 101  CO2 26 28  GLUCOSE 159* 178*  BUN 19 26*  CREATININE 0.99 1.32  CALCIUM 8.7 8.7    Medications:  I have reviewed the patient's current medications. Scheduled: . amLODipine  5 mg Oral Daily  . ampicillin-sulbactam (UNASYN) IV  3 g Intravenous 4 times per day  . antiseptic oral rinse  7 mL Mouth Rinse QID  . chlorhexidine  15 mL Mouth Rinse BID  . Chlorhexidine Gluconate Cloth  6 each Topical Q0600  . famotidine (PEPCID) IV  20 mg Intravenous Q12H  . furosemide  20 mg Intravenous Q12H  . furosemide  40 mg Intravenous BID  . hydrALAZINE  20 mg Intravenous 3 times per day  . HYDROmorphone PCA 0.3 mg/mL   Intravenous 6 times per day  . insulin aspart  0-15 Units Subcutaneous TID WC  . mupirocin ointment  1 application Nasal BID   OLM:[id number], diphenhydrAMINE **OR** diphenhydrAMINE, fentaNYL, labetalol, LORazepam, naloxone **AND** sodium chloride, ondansetron (ZOFRAN) IV, ondansetron (ZOFRAN)  IV  Assessment/Plan: POD #3. Acute epiglotitis/pharyngitis and small right peritonsillar abscess. Could not be intubated in the ER or the OR. Emergent trach placed 3 days ago. Possible pneumonia on CXR. On Unasyn.   - WBC has normalized.   - Pt will be transferred to floor later today.  - Will repeat laryngoscopy at bedside on Monday. Plan decannulation if swelling has decreased. - Continue IV abx.   LOS: 3 days   Mikela Senn,SUI W 06/28/2014, 2:03 PM

## 2014-06-28 NOTE — Progress Notes (Signed)
PULMONARY / CRITICAL CARE MEDICINE   Name: Dakota Jackson MRN: [id number] DOB: 10/16/1965    ADMISSION DATE:  06/24/2014 CONSULTATION DATE:  06/25/2014  REFERRING MD :  Dr. [name]  CHIEF COMPLAINT:  Acute epiglottitis  INITIAL PRESENTATION: 49 year-old male admitted on 06/25/2014 with acute epiglottitis requiring emergent tracheostomy.  STUDIES:  4/05  CT neck> supraglottitis noted, 8 mm right tonsillar fluid collection  SIGNIFICANT EVENTS: 4/06  emergent tracheostomy 06/26/14: fent increased for throat pain 06/27/14: Has been on ATC since 11:00 on 4/7. Had some throat and trach pain o/n   SUBJECTIVE/OVERNIGHT/INTERVAL HX 06/28/14: K repleted. REmains on ATC 35%. Had 1 episode vomiting yesterday. ENT plans repeat laryngoscopy to decide decannulation. TF continue. On abx. Having throat pain - fent prn not enough. Frustrated by his illness and wants to eat and struggles with pain - he is open to [place]: Temp:  [98.6 F (37 C)-100.3 F (37.9 C)] 99.7 F (37.6 C) (04/09 0800) Pulse Rate:  [85-125] 100 (04/09 1000) Resp:  [15-36] 17 (04/09 1000) BP: (103-179)/(44-85) 130/52 mmHg (04/09 1000) SpO2:  [90 %-98 %] 97 % (04/09 1000) FiO2 (%):  [28 %-40 %] 28 % (04/09 0800) Weight:  [89.2 kg (196 lb 10.4 oz)] 89.2 kg (196 lb 10.4 oz) (04/09 0433)   HEMODYNAMICS:     VENTILATOR SETTINGS: Vent Mode:  [-]  FiO2 (%):  [28 %-40 %] 28 %   INTAKE / OUTPUT:  Intake/Output Summary (Last 24 hours) at 06/28/14 1035 Last data filed at 06/28/14 1010  Gross per 24 hour  Intake 2260.67 ml  Output   2075 ml  Net 185.67 ml    PHYSICAL EXAMINATION: General:  wdwn adult male in NAD  Neuro:  Awake, alert.  MAE.  Communicates by writing HEENT:  Mm pink/moist, no jvd, trach midline c/d/i (#8) Cardiovascular:  s1s2 rrr, no m/r/g  Lungs:  resp's even/non-labored, scattered rhonchi  Abdomen:  Round/soft, bsx4 active  Musculoskeletal:  No acute deformities  Skin:  Warm/dry, no edema    LABS: PULMONARY No results for input(s): PHART, PCO2ART, PO2ART, HCO3, TCO2, O2SAT in the last 168 hours.  Invalid input(s): PCO2, PO2  CBC  Recent Labs Lab 06/26/14 0350 06/27/14 0325 06/28/14 0400  HGB 14.6 14.2 14.5  HCT 43.4 42.3 43.5  WBC 16.6* 11.6* 9.2  PLT 187 201 240    COAGULATION No results for input(s): INR in the last 168 hours.  CARDIAC  No results for input(s): TROPONINI in the last 168 hours. No results for input(s): PROBNP in the last 168 hours.   CHEMISTRY  Recent Labs Lab 06/25/14 1040 06/25/14 1650 06/26/14 0350 06/27/14 0325 06/28/14 0400  NA 137 141 141 137 140  K 6.7* 4.4 4.7 3.4* 3.3*  CL 109 110 104 101 101  CO2 18* 22 26 26 28   GLUCOSE 177* 160* 152* 159* 178*  BUN 19 18 15 19  26*  CREATININE 1.50* 1.03 1.09 0.99 1.32  CALCIUM 7.5* 8.5 8.8 8.7 8.7  MG  --   --   --   --  2.0  PHOS  --   --   --   --  4.2   Estimated Creatinine Clearance: 72.1 mL/min (by C-G formula based on Cr of 1.32).   LIVER  Recent Labs Lab 06/25/14 1040  AST 43*  ALT 39  ALKPHOS 38*  BILITOT 0.9  PROT 6.3  ALBUMIN 3.3*     INFECTIOUS  Recent Labs Lab 06/25/14 1124 06/25/14  1650  LATICACIDVEN 4.1* 1.7     ENDOCRINE CBG (last 3)   Recent Labs  06/27/14 1635 06/27/14 2200 06/28/14 0021  GLUCAP 180* 174* 154*         IMAGING x48h Dg Chest Port 1 View  06/28/2014   CLINICAL DATA:  Acute respiratory failure.  EXAM: PORTABLE CHEST - 1 VIEW  COMPARISON:  06/27/2014 and 06/26/2014  FINDINGS: Tracheostomy tube and enteric tube unchanged. Lungs are hypoinflated in demonstrates significant interval improvement in the previously noted bilateral central airspace opacification likely resolving edema with minimal residual hazy density over the right midlung. No evidence of effusion or pneumothorax. Cardiomediastinal silhouette and remainder of the exam is unchanged.  IMPRESSION: Significant interval improvement in the previously noted  bilateral central airspace process likely resolving edema with minimal residual density in the right midlung.  Tubes and lines unchanged.   Electronically Signed   By: Marin Olp M.D.   On: 06/28/2014 07:56   Dg Chest Port 1 View  06/27/2014   CLINICAL DATA:  Pulmonary edema with increasing shortness of breath.  EXAM: PORTABLE CHEST - 1 VIEW  COMPARISON:  06/26/2014  FINDINGS: Shallow inspiration. Tracheostomy tube and feeding tube are unchanged in position. Persistent bilateral perihilar consolidation or edema. Increasing opacity demonstrated in the right lung base since prior study. This suggest progression. No blunting of costophrenic angles. No pneumothorax. No change since prior study.  IMPRESSION: Bilateral perihilar consolidation demonstrating increase in the right lung base.   Electronically Signed   By: Lucienne Capers M.D.   On: 06/27/2014 05:11       ASSESSMENT / PLAN:  PULMONARY Urgent Tracheostomy 4/06 >>  A:  Acute epiglottitis causing acute respiratory failure> now status post tracheostomy Diffuse Bilateral Pulmonary Infiltrates - Negative pressure pulmonary edema (most likely source of infiltrates) vs diastolic dysfxn,  infectious etiology vs ALI   - off vent x 48h. On ATC. CXR improving   P:   Continue ATC, should be able to tolerate Wean O2 for sats > 92% Trend CXR (continues to have infiltrates 4/9) prn  Postoperative tracheostomy management per otolaryngology -t hey are planning rescope to decide decannulation Diuresis as tolerates >  Repeat 06/28/14  Will assess for cuff down - ordered 06/28/14  Ordered SLP for PMV placement and trach care   CARDIOVASCULAR A:  Hypotension - sedation related. Resolved.  HTN - untreated at baseline  Intact LV systolic fxn, Grade 1 chronic diastolic dysfunction, mild RV dilation (TTE 4/7)   - no major issue. Pulm edema improving  P:  Start amlodipine 4/7, uptitrate as indicated Lasix 40mg  IV x 2 doses again on 4/8 and repeat 1  dose 06/28/14  RENAL A:   Acute Kidney Injury - Improved.  Baseline sr cr ~ 1.2 Hyperkalemia - no evidence of hemolysis 4/6, resolved Lactic Acidosis - resolved.    - mild low k repleted P:   Monitor BMET and UOP Replace electrolytes as needed Lasix as above   GASTROINTESTINAL A:   No acute issues. On TF since 06/26/14 P:   SLP consult if tolerates cuff down Pepcid for stress ulcer prophylaxis  HEMATOLOGIC A:  No acute issues P:  Monitor for bleeding  INFECTIOUS A:   Acute epiglottitis Peritonsillar Abscess - fluid collection seen on CT   B infiltrates, ? Possible aspiration PNA P:   BCx2 4/6 >>  Unasyn 4/6 >>  Follow for cough, fever, WBC  ENDOCRINE A:   No acute issues  P:   Monitor  glucose  NEUROLOGIC A:   Postoperative pain with Acute anxiety and agitation   - pain still an issue per patient 06/28/14 despite higher prn fent doses  P:   START DILAUDID PCA 06/28/2014 - patient likes this idea RASS goal: 0 Mobilize as able   FAMILY  - Updates:  Updated patient 06/28/2014.   Move to floor . PCCM for trach consult and [facility] from 06/29/14    Dr. Brand Males, M.D., [facility].C.P Pulmonary and Critical Care Medicine Staff Physician [facility] Pager: [phone], If no answer or between  15:00h - 7:00h: call [phone]  06/28/2014 10:35 AM

## 2014-06-29 DIAGNOSIS — Z930 Tracheostomy status: Secondary | ICD-10-CM

## 2014-06-29 LAB — BASIC METABOLIC PANEL
ANION GAP: 10 (ref 5–15)
BUN: 35 mg/dL — ABNORMAL HIGH (ref 6–23)
CHLORIDE: 107 mmol/L (ref 96–112)
CO2: 26 mmol/L (ref 19–32)
Calcium: 8.3 mg/dL — ABNORMAL LOW (ref 8.4–10.5)
Creatinine, Ser: 1.2 mg/dL (ref 0.50–1.35)
GFR calc Af Amer: 80 mL/min — ABNORMAL LOW (ref >90)
GFR, EST NON AFRICAN AMERICAN: 69 mL/min — AB (ref >90)
Glucose, Bld: 162 mg/dL — ABNORMAL HIGH (ref 70–99)
POTASSIUM: 3.5 mmol/L (ref 3.5–5.1)
SODIUM: 143 mmol/L (ref 135–145)

## 2014-06-29 LAB — CBC WITH DIFFERENTIAL/PLATELET
Basophils Absolute: 0 10*3/uL (ref 0.0–0.1)
Basophils Relative: 0 % (ref 0–1)
EOS ABS: 0 10*3/uL (ref 0.0–0.7)
Eosinophils Relative: 0 % (ref 0–5)
HCT: 43.7 % (ref 39.0–52.0)
Hemoglobin: 14.3 g/dL (ref 13.0–17.0)
Lymphocytes Relative: 15 % (ref 12–46)
Lymphs Abs: 1.4 10*3/uL (ref 0.7–4.0)
MCH: 29.1 pg (ref 26.0–34.0)
MCHC: 32.7 g/dL (ref 30.0–36.0)
MCV: 89 fL (ref 78.0–100.0)
MONO ABS: 2.2 10*3/uL — AB (ref 0.1–1.0)
Monocytes Relative: 24 % — ABNORMAL HIGH (ref 3–12)
NEUTROS PCT: 61 % (ref 43–77)
Neutro Abs: 5.6 10*3/uL (ref 1.7–7.7)
Platelets: 272 10*3/uL (ref 150–400)
RBC: 4.91 MIL/uL (ref 4.22–5.81)
RDW: 14 % (ref 11.5–15.5)
WBC: 9.2 10*3/uL (ref 4.0–10.5)

## 2014-06-29 LAB — GLUCOSE, CAPILLARY
GLUCOSE-CAPILLARY: 121 mg/dL — AB (ref 70–99)
Glucose-Capillary: 107 mg/dL — ABNORMAL HIGH (ref 70–99)
Glucose-Capillary: 124 mg/dL — ABNORMAL HIGH (ref 70–99)
Glucose-Capillary: 153 mg/dL — ABNORMAL HIGH (ref 70–99)
Glucose-Capillary: 96 mg/dL (ref 70–99)

## 2014-06-29 NOTE — Progress Notes (Signed)
Called to patient's room by his wife. The patient had gotten up, walked around his bed toward her on the window bed. He pulled out his panda while doing this activity. He is still alert and oriented and able to mouth his words stating he doesn't know how it came out. MD on call made aware and will pass on to day shift nurse in the am.

## 2014-06-29 NOTE — Progress Notes (Addendum)
[facility] Progress Note   Dakota Jackson LOV:[id number] DOB: [birth year] DOA: 06/24/2014 PCP: Scarlette Calico, MD  Brief narrative: Dakota Jackson is a 49 y.o. male with h/o HTN on no medications at home who presented on 4/6 to [facility] with sore throat and trouble swallowing which started 11AM the day before. He was seen in urgent care and was told he had a viral infection. Scope done in ER revealed mild epiglottic swelling. CT showed supraglottitis. ER attempted to intubate but due to epglottic edema, vocal cords not visualized. ENT called and attempted to intubate in the ER but was unsuccessful. Tracheostomy performed on 4/6 and he was placed on a ventilator. Taken off Vent on 4/7 and kept on Trach collar. Main issue was pain and inability to eat. Panda tube was being used to feed. Dilauded PCA started on 4/9. According to there RNs, the panda came out when he was trying to go to the sink to get a glass of water this morning.   Subjective: Wants to eat/ drink. Is not having any pain. No shortness of breath.   Assessment/Plan: Principal Problem:   Acute respiratory failure with hypoxia - infiltrates on CXR diffusely  - pulmonary suspecting neg pressure pulm edema- Lasix has been given- xray from 4/9 reveals significant improvement - will hold off on further diuretics at this time  Active Problems:    Epiglottitis/ right peritonsillar abscess  - cont Trach collar- Dr [name] (ENT) will scope again tomorrow - Loni Muse is now out- NPO for now-  SLP eval pending- per Dr Melvyn Novas, he is still unable to swallow saliva, tolerate occlusion of the trach or speak with trach occluded - cont Unasyn - Dilaudid PCA started for pain yesterday  AKI -Cr 1.5 on admission-  improved- Cr 1.2-  Hyperkalemia - resolved- posiblly from ARF  Gr 1 diastolic dysfunction- ECHO 06/26/14 - compensated  HTN - started on amlodipine on 4/7 but now not able to take as Panda pulled out- will d/c - Also on 20 mg IV  Hydralazine TID which I will continue-  BP stable on this    Code Status: full code Family Communication:  Disposition Plan: home when stable DVT prophylaxis: SCDs Consultants: ENT/ Pulmonary Procedures: tracheostomy  Antibiotics: Anti-infectives    Start     Dose/Rate Route Frequency Ordered Stop   06/25/14 0500  Ampicillin-Sulbactam (UNASYN) 3 g in sodium chloride 0.9 % 100 mL IVPB     3 g 100 mL/hr over 60 Minutes Intravenous 4 times per day 06/25/14 0451     06/25/14 0015  clindamycin (CLEOCIN) IVPB 600 mg     600 mg 100 mL/hr over 30 Minutes Intravenous  Once 06/25/14 0006 06/25/14 0055      Objective: Filed Weights   06/27/14 0400 06/28/14 0433 06/28/14 1615  Weight: 89.2 kg (196 lb 10.4 oz) 89.2 kg (196 lb 10.4 oz) 88.95 kg (196 lb 1.6 oz)    Intake/Output Summary (Last 24 hours) at 06/29/14 0839 Last data filed at 06/28/14 1755  Gross per 24 hour  Intake    685 ml  Output   1100 ml  Net   -415 ml     Vitals Filed Vitals:   06/29/14 0100 06/29/14 0400 06/29/14 0431 06/29/14 0658  BP:    121/62  Pulse:   100 92  Temp:    99.2 F (37.3 C)  TempSrc:    Oral  Resp: 14 16 18 16   Height:      Weight:  SpO2: 95% 96% 95% 97%    Exam:  General:  Pt is alert, not in acute distress  HEENT: No icterus, No thrush  Cardiovascular: regular rate and rhythm, S1/S2 No murmur  Respiratory: clear to auscultation bilaterally - trach collar in place  Abdomen: Soft, +Bowel sounds, non tender, non distended, no guarding  MSK: No LE edema, cyanosis or clubbing  Data Reviewed: Basic Metabolic Panel:  Recent Labs Lab 06/25/14 1650 06/26/14 0350 06/27/14 0325 06/28/14 0400 06/29/14 0450  NA 141 141 137 140 143  K 4.4 4.7 3.4* 3.3* 3.5  CL 110 104 101 101 107  CO2 22 26 26 28 26   GLUCOSE 160* 152* 159* 178* 162*  BUN 18 15 19  26* 35*  CREATININE 1.03 1.09 0.99 1.32 1.20  CALCIUM 8.5 8.8 8.7 8.7 8.3*  MG  --   --   --  2.0  --   PHOS  --   --   --  4.2   --    Liver Function Tests:  Recent Labs Lab 06/25/14 1040  AST 43*  ALT 39  ALKPHOS 38*  BILITOT 0.9  PROT 6.3  ALBUMIN 3.3*   No results for input(s): LIPASE, AMYLASE in the last 168 hours. No results for input(s): AMMONIA in the last 168 hours. CBC:  Recent Labs Lab 06/25/14 1040 06/26/14 0350 06/27/14 0325 06/28/14 0400 06/29/14 0450  WBC 13.5* 16.6* 11.6* 9.2 9.2  NEUTROABS  --   --   --   --  5.6  HGB 13.6 14.6 14.2 14.5 14.3  HCT 40.6 43.4 42.3 43.5 43.7  MCV 88.8 88.8 87.9 87.9 89.0  PLT 182 187 201 240 272   Cardiac Enzymes: No results for input(s): CKTOTAL, CKMB, CKMBINDEX, TROPONINI in the last 168 hours. BNP (last 3 results) No results for input(s): BNP in the last 8760 hours.  ProBNP (last 3 results) No results for input(s): PROBNP in the last 8760 hours.  CBG:  Recent Labs Lab 06/28/14 1139 06/28/14 1655 06/28/14 2040 06/29/14 0029 06/29/14 0756  GLUCAP 183* 130* 155* 153* 124*    Recent Results (from the past 240 hour(s))  Rapid strep screen     Status: None   Collection Time: 06/23/14  3:34 PM  Result Value Ref Range Status   Streptococcus, Group A Screen (Direct) NEGATIVE NEGATIVE Final    Comment: (NOTE) A Rapid Antigen test may result negative if the antigen level in the sample is below the detection level of this test. The FDA has not cleared this test as a stand-alone test therefore the rapid antigen negative result has reflexed to a Group A Strep culture.   Culture, Group A Strep     Status: None   Collection Time: 06/23/14  3:34 PM  Result Value Ref Range Status   Strep A Culture Negative  Final    Comment: (NOTE) Performed At: [facility] [place], [place] [id number] Lindon Romp MD [id number]   MRSA PCR Screening     Status: Abnormal   Collection Time: 06/25/14  4:08 AM  Result Value Ref Range Status   MRSA by PCR POSITIVE (A) NEGATIVE Final    Comment:        The GeneXpert MRSA Assay  (FDA approved for NASAL specimens only), is one component of a comprehensive MRSA colonization surveillance program. It is not intended to diagnose MRSA infection nor to guide or monitor treatment for MRSA infections. RESULT CALLED TO, READ BACK BY AND VERIFIED WITH:  S. EARLY RN AT [id number] ON 04.06.16 BY SHUEA   Culture, blood (routine x 2)     Status: None (Preliminary result)   Collection Time: 06/25/14  6:15 AM  Result Value Ref Range Status   Specimen Description BLOOD RIGHT HAND  Final   Special Requests BOTTLES DRAWN AEROBIC ONLY 6CC  Final   Culture   Final           BLOOD CULTURE RECEIVED NO GROWTH TO DATE CULTURE WILL BE HELD FOR 5 DAYS BEFORE ISSUING A FINAL NEGATIVE REPORT Performed at [facility]    Report Status PENDING  Incomplete  Culture, blood (routine x 2)     Status: None (Preliminary result)   Collection Time: 06/25/14  6:25 AM  Result Value Ref Range Status   Specimen Description BLOOD LEFT HAND  Final   Special Requests BOTTLES DRAWN AEROBIC AND ANAEROBIC 4CC  Final   Culture   Final           BLOOD CULTURE RECEIVED NO GROWTH TO DATE CULTURE WILL BE HELD FOR 5 DAYS BEFORE ISSUING A FINAL NEGATIVE REPORT Performed at [facility]    Report Status PENDING  Incomplete     Studies:  Recent x-ray studies have been reviewed in detail by the Attending Physician  Scheduled Meds:  Scheduled Meds: . amLODipine  5 mg Oral Daily  . ampicillin-sulbactam (UNASYN) IV  3 g Intravenous 4 times per day  . antiseptic oral rinse  7 mL Mouth Rinse QID  . chlorhexidine  15 mL Mouth Rinse BID  . Chlorhexidine Gluconate Cloth  6 each Topical Q0600  . famotidine (PEPCID) IV  20 mg Intravenous Q12H  . furosemide  20 mg Intravenous Q12H  . hydrALAZINE  20 mg Intravenous 3 times per day  . HYDROmorphone PCA 0.3 mg/mL   Intravenous 6 times per day  . insulin aspart  0-15 Units Subcutaneous TID WC  . mupirocin ointment  1 application Nasal BID   Continuous  Infusions: . feeding supplement (VITAL AF 1.2 CAL) 1,000 mL (06/28/14 1813)    Time spent on care of this patient: 54min   Larysa Pall, MD 06/29/2014, 8:39 AM  LOS: 4 days   [facility] Office  [phone] Pager - Text Page per [email or web address]  If 7PM-7AM, please contact night-coverage [email or web address]

## 2014-06-29 NOTE — Progress Notes (Signed)
PULMONARY / CRITICAL CARE MEDICINE   Name: Dakota Jackson MRN: [id number] DOB: [birth year]    ADMISSION DATE:  06/24/2014 CONSULTATION DATE:  06/25/2014  REFERRING MD :  Dr. [name]  CHIEF COMPLAINT:  Acute epiglottitis  INITIAL PRESENTATION: 61 ypbm  admitted on 06/25/2014 with acute epiglottitis requiring emergent tracheostomy.  STUDIES:  4/05  CT neck> supraglottitis noted, 8 mm right tonsillar fluid collection  SIGNIFICANT EVENTS: 4/06  emergent tracheostomy [name])  06/26/14: fent increased for throat pain 06/27/14: Has been on ATC since 11:00 on 4/7. Had some throat and trach pain o/n   SUBJECTIVE/OVERNIGHT/INTERVAL HX  still unable to swallow saliva or tolerate occlusion of trach or speak with trach occluded     VITAL SIGNS: Temp:  [97.4 F (36.3 C)-99.9 F (37.7 C)] 98.9 F (37.2 C) (04/10 0945) Pulse Rate:  [84-131] 120 (04/10 1100) Resp:  [14-22] 18 (04/10 1100) BP: (121-142)/(62-106) 126/67 mmHg (04/10 0945) SpO2:  [92 %-99 %] 92 % (04/10 1100) FiO2 (%):  [28 %] 28 % (04/10 1100) Weight:  [196 lb 1.6 oz (88.95 kg)] 196 lb 1.6 oz (88.95 kg) (04/09 1615)   HEMODYNAMICS:     VENTILATOR SETTINGS: Vent Mode:  [-]  FiO2 (%):  [28 %] 28 %   INTAKE / OUTPUT:  Intake/Output Summary (Last 24 hours) at 06/29/14 1234 Last data filed at 06/29/14 0900  Gross per 24 hour  Intake     65 ml  Output    500 ml  Net   -435 ml    PHYSICAL EXAMINATION: General:  wdwn frustrated  male in NAD  Neuro:  Awake, alert.  MAE.  Communicates by writing HEENT:  Mm pink/moist, no jvd, trach midline c/d/i (#8) Cardiovascular:  s1s2 rrr, no m/r/g  Lungs:  resp's even/non-labored, minimal scattered rhonchi / good air movement only with trach open/ very little if any with trach occluded  Abdomen:  Round/soft, bsx4 active  Musculoskeletal:  No acute deformities  Skin:  Warm/dry, no edema   LABS: PULMONARY No results for input(s): PHART, PCO2ART, PO2ART, HCO3, TCO2, O2SAT in the last  168 hours.  Invalid input(s): PCO2, PO2  CBC  Recent Labs Lab 06/27/14 0325 06/28/14 0400 06/29/14 0450  HGB 14.2 14.5 14.3  HCT 42.3 43.5 43.7  WBC 11.6* 9.2 9.2  PLT 201 240 272    COAGULATION No results for input(s): INR in the last 168 hours.  CARDIAC  No results for input(s): TROPONINI in the last 168 hours. No results for input(s): PROBNP in the last 168 hours.   CHEMISTRY  Recent Labs Lab 06/25/14 1650 06/26/14 0350 06/27/14 0325 06/28/14 0400 06/29/14 0450  NA 141 141 137 140 143  K 4.4 4.7 3.4* 3.3* 3.5  CL 110 104 101 101 107  CO2 22 26 26 28 26   GLUCOSE 160* 152* 159* 178* 162*  BUN 18 15 19  26* 35*  CREATININE 1.03 1.09 0.99 1.32 1.20  CALCIUM 8.5 8.8 8.7 8.7 8.3*  MG  --   --   --  2.0  --   PHOS  --   --   --  4.2  --    Estimated Creatinine Clearance: 79.3 mL/min (by C-G formula based on Cr of 1.2).   LIVER  Recent Labs Lab 06/25/14 1040  AST 43*  ALT 39  ALKPHOS 38*  BILITOT 0.9  PROT 6.3  ALBUMIN 3.3*     INFECTIOUS  Recent Labs Lab 06/25/14 1124 06/25/14 1650  LATICACIDVEN 4.1* 1.7  ENDOCRINE CBG (last 3)   Recent Labs  06/29/14 0029 06/29/14 0756 06/29/14 1116  GLUCAP 153* 124* 121*         IMAGING x48h Dg Chest Port 1 View  06/28/2014   CLINICAL DATA:  Acute respiratory failure.  EXAM: PORTABLE CHEST - 1 VIEW  COMPARISON:  06/27/2014 and 06/26/2014  FINDINGS: Tracheostomy tube and enteric tube unchanged. Lungs are hypoinflated in demonstrates significant interval improvement in the previously noted bilateral central airspace opacification likely resolving edema with minimal residual hazy density over the right midlung. No evidence of effusion or pneumothorax. Cardiomediastinal silhouette and remainder of the exam is unchanged.  IMPRESSION: Significant interval improvement in the previously noted bilateral central airspace process likely resolving edema with minimal residual density in the right midlung.   Tubes and lines unchanged.   Electronically Signed   By: Marin Olp M.D.   On: 06/28/2014 07:56       ASSESSMENT / PLAN:  PULMONARY Urgent Tracheostomy 4/06 >>  A:  Acute epiglottitis causing acute respiratory failure> now status post tracheostomy 4/6 ([name]) Diffuse Bilateral Pulmonary Infiltrates - Negative pressure pulmonary edema (most likely source of infiltrates) vs diastolic dysfxn,  infectious etiology vs ALI > improving rapidly so favor neg pressure pulmonary edema      P:   Continue ATC  Wean O2 for sats > 92%  Postoperative tracheostomy management per otolaryngology - planning rescope to decide decannulation 4/11 but doubt feasible   Ordered SLP for PMV placement and trach care   CARDIOVASCULAR A:  Hypotension - sedation related. Resolved.  HTN - untreated at baseline  Intact LV systolic fxn, Grade 1 chronic diastolic dysfunction, mild RV dilation (TTE 4/7)   - no major issue. Pulm edema improving  P:  Start amlodipine 4/7, uptitrate as indicated Lasix 40mg  IV x 2 doses again on 4/8 and repeat 1 dose 06/28/14  RENAL A:   Acute Kidney Injury - Improved.  Baseline sr cr ~ 1.2 Hyperkalemia - no evidence of hemolysis 4/6, resolved Lactic Acidosis - resolved.   P:   Monitor BMET and UOP Replace electrolytes as needed    GASTROINTESTINAL A:   No acute issues. On TF since 06/26/14 P:   SLP consult if tolerates cuff down Pepcid for stress ulcer prophylaxis  HEMATOLOGIC A:  No acute issues P:  Monitor for bleeding  INFECTIOUS A:   Acute epiglottitis Peritonsillar Abscess - fluid collection seen on CT   B infiltrates, ? Possible aspiration PNA P:   BCx2 4/6 >>  Unasyn 4/6 >>     ENDOCRINE A:   No acute issues  P:   Monitor glucose  NEUROLOGIC A:   Postoperative pain with Acute anxiety and agitation   - pain still an issue per patient 06/28/14 despite higher prn fent doses  P:  Rx  DILAUDID PCA 06/28/2014 - patient likes this idea RASS  goal: 0 Mobilize as able   Discussed with wife at  Bedside 4/10 > waiting on ENT re-eval 4/11 for further steps/ discharge planning    Christinia Gully, MD Pulmonary and [place] [phone] After 5:30 PM or weekends, call [phone]

## 2014-06-30 LAB — GLUCOSE, CAPILLARY
GLUCOSE-CAPILLARY: 109 mg/dL — AB (ref 70–99)
GLUCOSE-CAPILLARY: 192 mg/dL — AB (ref 70–99)
GLUCOSE-CAPILLARY: 136 mg/dL — AB (ref 70–99)
Glucose-Capillary: 117 mg/dL — ABNORMAL HIGH (ref 70–99)
Glucose-Capillary: 117 mg/dL — ABNORMAL HIGH (ref 70–99)
Glucose-Capillary: 122 mg/dL — ABNORMAL HIGH (ref 70–99)

## 2014-06-30 LAB — BASIC METABOLIC PANEL
Anion gap: 12 (ref 5–15)
BUN: 31 mg/dL — AB (ref 6–23)
CO2: 24 mmol/L (ref 19–32)
Calcium: 9 mg/dL (ref 8.4–10.5)
Chloride: 110 mmol/L (ref 96–112)
Creatinine, Ser: 1.11 mg/dL (ref 0.50–1.35)
GFR calc Af Amer: 88 mL/min — ABNORMAL LOW (ref >90)
GFR, EST NON AFRICAN AMERICAN: 76 mL/min — AB (ref >90)
GLUCOSE: 131 mg/dL — AB (ref 70–99)
Potassium: 3.9 mmol/L (ref 3.5–5.1)
Sodium: 146 mmol/L — ABNORMAL HIGH (ref 135–145)

## 2014-06-30 MED ORDER — ACETAMINOPHEN 325 MG PO TABS
650.0000 mg | ORAL_TABLET | ORAL | Status: DC | PRN
  Administered 2014-06-30 – 2014-07-03 (×3): 650 mg via ORAL
  Filled 2014-06-30 (×2): qty 2

## 2014-06-30 MED ORDER — MORPHINE SULFATE 2 MG/ML IJ SOLN
2.0000 mg | INTRAMUSCULAR | Status: DC | PRN
  Administered 2014-07-01 – 2014-07-02 (×2): 2 mg via INTRAVENOUS
  Filled 2014-06-30 (×2): qty 1

## 2014-06-30 NOTE — Progress Notes (Signed)
NUTRITION FOLLOW-UP  INTERVENTION: Diet advancement per MD RD will continue to monitor  NUTRITION DIAGNOSIS: Inadequate oral intake related to inability to eat as evidenced by NPO, ongoing.   Goal: Pt to meet >/= 90% of their estimated nutrition needs, unmet   Monitor:  Diet advancement, weight, labs, I/O's  ASSESSMENT: Pt admitted on 4/6 with acute epiglottitis requiring emergent tracheostomy.  Per interdisciplinary rounds, Panda tube was pulled out 4/10. Pt eager to [place].  SLP eval pending. Pt tolerating some clear liquids. Will monitor for diet advancement.  Labs reviewed: Elevated Na  Height: Ht Readings from Last 1 Encounters:  06/28/14 5\' 11"  (1.803 m)    Weight: Wt Readings from Last 1 Encounters:  06/30/14 179 lb (81.194 kg)    BMI:  Body mass index is 24.98 kg/(m^2).  Re-estimated Nutritional Needs: Kcal: 2100-2300 Protein: 115-125 g Fluid: >2 L/day  Skin: intact  Diet Order: Diet NPO time specified   Intake/Output Summary (Last 24 hours) at 06/30/14 1033 Last data filed at 06/30/14 0621  Gross per 24 hour  Intake   1348 ml  Output   1925 ml  Net   -577 ml    Last BM: 4/10  Labs:   Recent Labs Lab 06/28/14 0400 06/29/14 0450 06/30/14 0500  NA 140 143 146*  K 3.3* 3.5 3.9  CL 101 107 110  CO2 28 26 24   BUN 26* 35* 31*  CREATININE 1.32 1.20 1.11  CALCIUM 8.7 8.3* 9.0  MG 2.0  --   --   PHOS 4.2  --   --   GLUCOSE 178* 162* 131*    CBG (last 3)   Recent Labs  06/30/14 0024 06/30/14 0415 06/30/14 0923  GLUCAP 122* 109* 117*    Scheduled Meds: . ampicillin-sulbactam (UNASYN) IV  3 g Intravenous 4 times per day  . antiseptic oral rinse  7 mL Mouth Rinse QID  . chlorhexidine  15 mL Mouth Rinse BID  . Chlorhexidine Gluconate Cloth  6 each Topical Q0600  . famotidine (PEPCID) IV  20 mg Intravenous Q12H  . hydrALAZINE  20 mg Intravenous 3 times per day  . HYDROmorphone PCA 0.3 mg/mL   Intravenous 6 times per day     Continuous Infusions:     Dakota Bibles, MS, RD, LDN Pager: [phone] After Hours Pager: [phone]

## 2014-06-30 NOTE — Care Management Note (Signed)
CARE MANAGEMENT NOTE 06/30/2014  Patient:  GEMINI, BEAUMIER   Account Number:  [id number]  Date Initiated:  06/25/2014  Documentation initiated by:  DAVIS,RHONDA  Subjective/Objective Assessment:   airway obstruction requiring traceostomy and vent support     Action/Plan:   tbd   Anticipated DC Date:  06/28/2014   Anticipated DC Plan:  HOME/SELF CARE  In-house referral  NA      DC Planning Services  CM consult      PAC Choice  NA   Choice offered to / List presented to:  NA           Status of service:  In process, will continue to follow Medicare Important Message given?   (If response is "NO", the following Medicare IM given date fields will be blank) Date Medicare IM given:   Medicare IM given by:   Date Additional Medicare IM given:   Additional Medicare IM given by:    Discharge Disposition:    Per UR Regulation:  Reviewed for med. necessity/level of care/duration of stay  If discussed at [place] of Stay Meetings, dates discussed:    Comments:  06/30/14 Marney Doctor RN,BSN,NCM [phone] Pt transferred to 3W on 4/9. Pt had trach DC'd today and has order for a speech eval. CM will continue to monitor for DC needs.  June 25, 2014/Rhonda L. Rosana Hoes, RN, BSN, CCM. Case Management [place] [phone] No discharge needs present of time of review.

## 2014-06-30 NOTE — Progress Notes (Signed)
PULMONARY / CRITICAL CARE MEDICINE   Name: Dakota Jackson MRN: [id number] DOB: 1965-04-06    ADMISSION DATE:  06/24/2014 CONSULTATION DATE:  06/25/2014  REFERRING MD :  Dr. [name]  CHIEF COMPLAINT:  Acute epiglottitis  INITIAL PRESENTATION: 59 ypbm  admitted on 06/25/2014 with acute epiglottitis requiring emergent tracheostomy.  STUDIES:  4/05  CT neck> supraglottitis noted, 8 mm right tonsillar fluid collection  SIGNIFICANT EVENTS: 4/06  emergent tracheostomy [name])  06/26/14: fent increased for throat pain 06/27/14: Has been on ATC since 11:00 on 4/7. Had some throat and trach pain o/n   SUBJECTIVE/OVERNIGHT/INTERVAL HX Not able to tolerate trach occlusion, is drinking clears   VITAL SIGNS: Temp:  [97.4 F (36.3 C)-98.9 F (37.2 C)] 97.4 F (36.3 C) (04/11 0500) Pulse Rate:  [81-120] 96 (04/11 0805) Resp:  [16-20] 18 (04/11 0805) BP: (125-148)/(61-78) 127/78 mmHg (04/11 0500) SpO2:  [92 %-100 %] 98 % (04/11 0805) FiO2 (%):  [28 %] 28 % (04/11 0805) Weight:  [81.194 kg (179 lb)] 81.194 kg (179 lb) (04/11 0500)   INTAKE / OUTPUT:  Intake/Output Summary (Last 24 hours) at 06/30/14 0857 Last data filed at 06/30/14 2500  Gross per 24 hour  Intake   1348 ml  Output   2125 ml  Net   -777 ml    PHYSICAL EXAMINATION: General: resting comfortably, no distress.  Neuro:  Awake, alert.  MAE.  Communicates by writing HEENT:  Mm pink/moist, no jvd, trach midline c/d/i (#8); some old thick secretions. Not able to phonate w/ finger occlusion  Cardiovascular:  s1s2 rrr, no m/r/g  Lungs:  resp's even/non-labored, minimal scattered rhonchi / good air movement only with trach open Abdomen:  Round/soft, bsx4 active  Musculoskeletal:  No acute deformities  Skin:  Warm/dry, no edema   LABS:  Recent Labs Lab 06/27/14 0325 06/28/14 0400 06/29/14 0450  HGB 14.2 14.5 14.3  HCT 42.3 43.5 43.7  WBC 11.6* 9.2 9.2  PLT 201 240 272    Recent Labs Lab 06/26/14 0350 06/27/14 0325  06/28/14 0400 06/29/14 0450 06/30/14 0500  NA 141 137 140 143 146*  K 4.7 3.4* 3.3* 3.5 3.9  CL 104 101 101 107 110  CO2 26 26 28 26 24   GLUCOSE 152* 159* 178* 162* 131*  BUN 15 19 26* 35* 31*  CREATININE 1.09 0.99 1.32 1.20 1.11  CALCIUM 8.8 8.7 8.7 8.3* 9.0  MG  --   --  2.0  --   --   PHOS  --   --  4.2  --   --    Estimated Creatinine Clearance: 85.7 mL/min (by C-G formula based on Cr of 1.11).  IMAGING x48h No results found.   ASSESSMENT / PLAN:  Acute epiglottitis w/ acute airway obstruction causing acute respiratory failure> now status post tracheostomy 4/6 ([name]) Diffuse Bilateral Pulmonary Infiltrates - Negative pressure pulmonary edema (most likely source of infiltrates) vs diastolic dysfxn,  infectious etiology vs ALI > improving rapidly so favor neg pressure pulmonary edema  >clinically improved. Exam still suggesting some airway obstruction. Not clear if this still represents residual airway edema, or simply trach size (8 cuffed) although usually an adult male can pass air past a deflated 8 cuffed trach; if cuff deflated.   Plan:   -Continue ATC; Wean O2 for sats > 92% - Postoperative tracheostomy management per otolaryngology - planning rescope to decide decannulation 4/11 but doubt feasible-->given exam -SLP eval ordered. Doubt he will be ready for PMV, but he has  been taking clear liquids so should be able to eat. If ENT agrees and feels safe, perhaps 6 cuffless might be the next step to allow his to swallow easier and also possibly phonate.  - cont Unasyn (started 4/6)>>> - f/u BC from 4/6.    HTN - untreated at baseline  Intact LV systolic fxn, Grade 1 chronic diastolic dysfunction, mild RV dilation (TTE 4/7)  - no major issue. Pulm edema improving Plan:  Started amlodipine 4/7, uptitrate as indicated   Discussed with wife at  Bedside 4/11 > waiting on ENT re-eval 4/11 for further steps/ discharge [place] ACNP-BC [place] Pager # [phone] OR # [phone] if no answer

## 2014-06-30 NOTE — Progress Notes (Signed)
ANTIBIOTIC CONSULT NOTE  Pharmacy Consult for unasyn Indication: acute epiglotitis  No Known Allergies  Patient Measurements: Height: 5\' 11"  (180.3 cm) Weight: 179 lb (81.194 kg) IBW/kg (Calculated) : 75.3  Vital Signs: Temp: 97.4 F (36.3 C) (04/11 0500) Temp Source: Oral (04/11 0500) BP: 127/78 mmHg (04/11 0500) Pulse Rate: 98 (04/11 1126) Intake/Output from previous day: 04/10 0701 - 04/11 0700 In: 1348 [I.V.:7; IV Piggyback:250] Out: 2125 [Urine:2125] Intake/Output from this shift:    Labs:  Recent Labs  06/28/14 0400 06/29/14 0450 06/30/14 0500  WBC 9.2 9.2  --   HGB 14.5 14.3  --   PLT 240 272  --   CREATININE 1.32 1.20 1.11   Estimated Creatinine Clearance: 85.7 mL/min (by C-G formula based on Cr of 1.11). No results for input(s): VANCOTROUGH, VANCOPEAK, VANCORANDOM, GENTTROUGH, GENTPEAK, GENTRANDOM, TOBRATROUGH, TOBRAPEAK, TOBRARND, AMIKACINPEAK, AMIKACINTROU, AMIKACIN in the last 72 hours.   Microbiology: Recent Results (from the past 720 hour(s))  Rapid strep screen     Status: None   Collection Time: 06/23/14  3:34 PM  Result Value Ref Range Status   Streptococcus, Group A Screen (Direct) NEGATIVE NEGATIVE Final    Comment: (NOTE) A Rapid Antigen test may result negative if the antigen level in the sample is below the detection level of this test. The FDA has not cleared this test as a stand-alone test therefore the rapid antigen negative result has reflexed to a Group A Strep culture.   Culture, Group A Strep     Status: None   Collection Time: 06/23/14  3:34 PM  Result Value Ref Range Status   Strep A Culture Negative  Final    Comment: (NOTE) Performed At: [facility] [place], [place] [id number] Lindon Romp MD HG:[id number]   MRSA PCR Screening     Status: Abnormal   Collection Time: 06/25/14  4:08 AM  Result Value Ref Range Status   MRSA by PCR POSITIVE (A) NEGATIVE Final    Comment:        The GeneXpert  MRSA Assay (FDA approved for NASAL specimens only), is one component of a comprehensive MRSA colonization surveillance program. It is not intended to diagnose MRSA infection nor to guide or monitor treatment for MRSA infections. RESULT CALLED TO, READ BACK BY AND VERIFIED WITH: S. EARLY RN AT 0555 ON 04.06.16 BY SHUEA   Culture, blood (routine x 2)     Status: None (Preliminary result)   Collection Time: 06/25/14  6:15 AM  Result Value Ref Range Status   Specimen Description BLOOD RIGHT HAND  Final   Special Requests BOTTLES DRAWN AEROBIC ONLY 6CC  Final   Culture   Final           BLOOD CULTURE RECEIVED NO GROWTH TO DATE CULTURE WILL BE HELD FOR 5 DAYS BEFORE ISSUING A FINAL NEGATIVE REPORT Performed at [facility]    Report Status PENDING  Incomplete  Culture, blood (routine x 2)     Status: None (Preliminary result)   Collection Time: 06/25/14  6:25 AM  Result Value Ref Range Status   Specimen Description BLOOD LEFT HAND  Final   Special Requests BOTTLES DRAWN AEROBIC AND ANAEROBIC 4CC  Final   Culture   Final           BLOOD CULTURE RECEIVED NO GROWTH TO DATE CULTURE WILL BE HELD FOR 5 DAYS BEFORE ISSUING A FINAL NEGATIVE REPORT Performed at [facility]    Report Status PENDING  Incomplete   Medications:  Anti-infectives    Start     Dose/Rate Route Frequency Ordered Stop   06/25/14 0500  Ampicillin-Sulbactam (UNASYN) 3 g in sodium chloride 0.9 % 100 mL IVPB     3 g 100 mL/hr over 60 Minutes Intravenous 4 times per day 06/25/14 0451     06/25/14 0015  clindamycin (CLEOCIN) IVPB 600 mg     600 mg 100 mL/hr over 30 Minutes Intravenous  Once 06/25/14 0006 06/25/14 0055     Assessment 44 YOM presented to Urgent care, then [facility] ED on 4/4 dt biting his tongue, trouble breathing/swallowing, extreme pain in head/neck>> [facility] on 4/5 dt sore throat & dental pain>>viral pharyngitis>>4/6 [facility] dt epiglotits w/ airway obstruction & peritonsillar abscess>>failed  intubated x4>>cricothyrotomy preformed>>trached in OR  PMH: arthritis, tubular adenoma of colon, current smoker, alcohol use  4/6 >> unasyn >>   Temp: Afeb WBC: wnl (given decadron)  Renal: normal function   4/6 bloodx2: ngtd  4/6 MRSA PCR>>pos   Goal of Therapy:   Eradication of infection  Appropriate antibiotic dosing for indication and renal function  Plan:  Day 6 of antibiotics   Continue Unasyn 3 g IV q6 hr, continue abx per ENT MD, no LOT noted  Follow clinical course, renal function, culture results as available  Follow for de-escalation of antibiotics and LOT  Minda Ditto PharmD Pager [phone] 06/30/2014, 1:51 PM

## 2014-06-30 NOTE — Progress Notes (Signed)
ENT removed trach.  Pt has stoma and it is covered at this time on RA.  Pt is stable and tolerating well. Communicating with family and in good spirit.

## 2014-06-30 NOTE — Progress Notes (Addendum)
[facility] Progress Note   SEKAI GITLIN CWC:[id number] DOB: 05-12-1965 DOA: 06/24/2014 PCP: Scarlette Calico, MD  Brief narrative: Dakota Jackson is a 49 y.o. male with h/o HTN on no medications at home who presented on 4/6 to [facility] ER with sore throat and trouble swallowing which started 11AM the day before. He was seen in urgent care and was told he had a viral infection. Scope done in ER revealed mild epiglottic swelling. CT showed supraglottitis. ER attempted to intubate but due to epglottic edema, vocal cords not visualized. ENT called and attempted to intubate in the ER but was unsuccessful. Tracheostomy performed on 4/6 and he was placed on a ventilator. Taken off Vent on 4/7 and kept on Trach collar. Main issue was pain and inability to eat. Panda tube was being used to feed. Dilauded PCA started on 4/9. According to there RNs, the panda came out when he was trying to go to the sink to get a glass of water this morning.   Subjective: States he is hungry - no complaints of pain.  Assessment/Plan: Principal Problem:   Acute respiratory failure with hypoxia - infiltrates on CXR diffusely  - pulmonary suspecting neg pressure pulm edema- Lasix was been given- xray from 4/9 reveals significant improvement - will hold off on further diuretics at this time- holding off on IVF as well for the past 24 hours - hopefully should be able to get an appropriate diet after speech eval today-will follow  Active Problems:    Epiglottitis/ right peritonsillar abscess  - Has had a Trach collar- Dr [name] (ENT) has done a laryngoscope today and removed the trach- he advises a speech eval and then appropriate diet- despite nothing by mouth status, apparently he has been drinking clear liquids - Panda tube came out on morning of 4/10 --  SLP eval ordered on Friday and still pending - cont Unasyn? Will allow ENT/PCC M to decide on this - Dilaudid PCA started for pain by ICU team-according to staff he is  barely using this-will DC it and use when necessary low-dose morphine  - AKI -Cr 1.5 on admission-  improved- Cr 1.2-  Hyperkalemia - resolved- posiblly from ARF  Gr 1 diastolic dysfunction- ECHO 06/26/14 - compensated  HTN - started on amlodipine on 4/7 but now not able to take as Panda pulled out-and he is still nothing by mouth - d/c'd on 4-10 - Also on 20 mg IV Hydralazine TID which I will continue-  BP stable on this    Code Status: full code Family Communication:  Disposition Plan: home when stable DVT prophylaxis: SCDs Consultants: ENT/ Pulmonary Procedures: tracheostomy  Antibiotics: Anti-infectives    Start     Dose/Rate Route Frequency Ordered Stop   06/25/14 0500  Ampicillin-Sulbactam (UNASYN) 3 g in sodium chloride 0.9 % 100 mL IVPB     3 g 100 mL/hr over 60 Minutes Intravenous 4 times per day 06/25/14 0451     06/25/14 0015  clindamycin (CLEOCIN) IVPB 600 mg     600 mg 100 mL/hr over 30 Minutes Intravenous  Once 06/25/14 0006 06/25/14 0055      Objective: Filed Weights   06/28/14 0433 06/28/14 1615 06/30/14 0500  Weight: 89.2 kg (196 lb 10.4 oz) 88.95 kg (196 lb 1.6 oz) 81.194 kg (179 lb)    Intake/Output Summary (Last 24 hours) at 06/30/14 1228 Last data filed at 06/30/14 0621  Gross per 24 hour  Intake   1348 ml  Output  1925 ml  Net   -577 ml     Vitals Filed Vitals:   06/30/14 0650 06/30/14 0758 06/30/14 0805 06/30/14 1126  BP:      Pulse: 95  96 98  Temp:      TempSrc:      Resp:  18 18 20   Height:      Weight:      SpO2: 99% 98% 98% 96%    Exam:  General:  Pt is alert, not in acute distress  HEENT: No icterus, No thrush  Cardiovascular: regular rate and rhythm, S1/S2 No murmur  Respiratory: clear to auscultation bilaterally - trach collar in place  Abdomen: Soft, +Bowel sounds, non tender, non distended, no guarding  MSK: No LE edema, cyanosis or clubbing  Data Reviewed: Basic Metabolic Panel:  Recent Labs Lab  06/26/14 0350 06/27/14 0325 06/28/14 0400 06/29/14 0450 06/30/14 0500  NA 141 137 140 143 146*  K 4.7 3.4* 3.3* 3.5 3.9  CL 104 101 101 107 110  CO2 26 26 28 26 24   GLUCOSE 152* 159* 178* 162* 131*  BUN 15 19 26* 35* 31*  CREATININE 1.09 0.99 1.32 1.20 1.11  CALCIUM 8.8 8.7 8.7 8.3* 9.0  MG  --   --  2.0  --   --   PHOS  --   --  4.2  --   --    Liver Function Tests:  Recent Labs Lab 06/25/14 1040  AST 43*  ALT 39  ALKPHOS 38*  BILITOT 0.9  PROT 6.3  ALBUMIN 3.3*   No results for input(s): LIPASE, AMYLASE in the last 168 hours. No results for input(s): AMMONIA in the last 168 hours. CBC:  Recent Labs Lab 06/25/14 1040 06/26/14 0350 06/27/14 0325 06/28/14 0400 06/29/14 0450  WBC 13.5* 16.6* 11.6* 9.2 9.2  NEUTROABS  --   --   --   --  5.6  HGB 13.6 14.6 14.2 14.5 14.3  HCT 40.6 43.4 42.3 43.5 43.7  MCV 88.8 88.8 87.9 87.9 89.0  PLT 182 187 201 240 272   Cardiac Enzymes: No results for input(s): CKTOTAL, CKMB, CKMBINDEX, TROPONINI in the last 168 hours. BNP (last 3 results) No results for input(s): BNP in the last 8760 hours.  ProBNP (last 3 results) No results for input(s): PROBNP in the last 8760 hours.  CBG:  Recent Labs Lab 06/29/14 2021 06/30/14 0024 06/30/14 0415 06/30/14 0923 06/30/14 1149  GLUCAP 107* 122* 109* 117* 117*    Recent Results (from the past 240 hour(s))  Rapid strep screen     Status: None   Collection Time: 06/23/14  3:34 PM  Result Value Ref Range Status   Streptococcus, Group A Screen (Direct) NEGATIVE NEGATIVE Final    Comment: (NOTE) A Rapid Antigen test may result negative if the antigen level in the sample is below the detection level of this test. The FDA has not cleared this test as a stand-alone test therefore the rapid antigen negative result has reflexed to a Group A Strep culture.   Culture, Group A Strep     Status: None   Collection Time: 06/23/14  3:34 PM  Result Value Ref Range Status   Strep A  Culture Negative  Final    Comment: (NOTE) Performed At: [facility] [place], [place] [id number] Lindon Romp MD BL:[id number]   MRSA PCR Screening     Status: Abnormal   Collection Time: 06/25/14  4:08 AM  Result Value Ref  Range Status   MRSA by PCR POSITIVE (A) NEGATIVE Final    Comment:        The GeneXpert MRSA Assay (FDA approved for NASAL specimens only), is one component of a comprehensive MRSA colonization surveillance program. It is not intended to diagnose MRSA infection nor to guide or monitor treatment for MRSA infections. RESULT CALLED TO, READ BACK BY AND VERIFIED WITH: S. EARLY RN AT 0555 ON 04.06.16 BY SHUEA   Culture, blood (routine x 2)     Status: None (Preliminary result)   Collection Time: 06/25/14  6:15 AM  Result Value Ref Range Status   Specimen Description BLOOD RIGHT HAND  Final   Special Requests BOTTLES DRAWN AEROBIC ONLY 6CC  Final   Culture   Final           BLOOD CULTURE RECEIVED NO GROWTH TO DATE CULTURE WILL BE HELD FOR 5 DAYS BEFORE ISSUING A FINAL NEGATIVE REPORT Performed at [facility]    Report Status PENDING  Incomplete  Culture, blood (routine x 2)     Status: None (Preliminary result)   Collection Time: 06/25/14  6:25 AM  Result Value Ref Range Status   Specimen Description BLOOD LEFT HAND  Final   Special Requests BOTTLES DRAWN AEROBIC AND ANAEROBIC 4CC  Final   Culture   Final           BLOOD CULTURE RECEIVED NO GROWTH TO DATE CULTURE WILL BE HELD FOR 5 DAYS BEFORE ISSUING A FINAL NEGATIVE REPORT Performed at [facility]    Report Status PENDING  Incomplete     Studies:  Recent x-ray studies have been reviewed in detail by the Attending Physician  Scheduled Meds:  Scheduled Meds: . ampicillin-sulbactam (UNASYN) IV  3 g Intravenous 4 times per day  . antiseptic oral rinse  7 mL Mouth Rinse QID  . chlorhexidine  15 mL Mouth Rinse BID  . Chlorhexidine Gluconate Cloth  6 each  Topical Q0600  . famotidine (PEPCID) IV  20 mg Intravenous Q12H  . hydrALAZINE  20 mg Intravenous 3 times per day  . HYDROmorphone PCA 0.3 mg/mL   Intravenous 6 times per day   Continuous Infusions:    Time spent on care of this patient: 40min   Debbe Odea, MD 06/30/2014, 12:28 PM  LOS: 5 days   [facility] Office  [phone] Pager - Text Page per [email or web address]  If 7PM-7AM, please contact night-coverage [email or web address]

## 2014-06-30 NOTE — Progress Notes (Signed)
Subjective: Pt resting comfortably in bed. No new complaint.  Objective: Vital signs in last 24 hours: Temp:  [97.4 F (36.3 C)-98.3 F (36.8 C)] 97.4 F (36.3 C) (04/11 0500) Pulse Rate:  [81-112] 98 (04/11 1126) Resp:  [16-20] 20 (04/11 1126) BP: (125-148)/(61-78) 127/78 mmHg (04/11 0500) SpO2:  [95 %-100 %] 96 % (04/11 1126) FiO2 (%):  [28 %] 28 % (04/11 0805) Weight:  [81.194 kg (179 lb)] 81.194 kg (179 lb) (04/11 0500)  Physical Exam  Constitutional: Pt is awake and alert. Head: Normocephalic and atraumatic.  Ears: Auricles and EACs normal. Nose: Normal mucosa, septum, and turbinates. Mouth/Throat: Oral mucosa edema has significantly decreased. Neck: Neck supple. No mass or lesion. Trach in place. No bleeding. Cardiovascular: Normal rate and rhythm.  Skin: Skin is warm and dry.   Procedure: Flexible Fiberoptic Laryngoscopy Anesthesia: Topical oxymetazoline and lidocaine Description: Risks, benefits, and alternatives of flexible endoscopy were explained to the patient. Specific mention was made of the risk of throat numbness with difficulty swallowing, possible bleeding from the nose and mouth, and pain from the procedure. The patient gave oral consent to proceed. The nasal cavities were decongested and anesthetised with a combination of oxymetazoline and 4% lidocaine solution. The flexible scope was inserted into the left nasal cavity and advanced towards the nasopharynx. Visualized mucosa over the turbinates and septum were normal. The nasopharynx was clear. Oropharyngeal and hypopharyngeal walls were normal.The epiglotis and the larynx are mildly edematous. Posterior commissure with mild edema and redundant mucosa. True vocal folds were pale yellow and symmetric without obstruction.   Recent Labs  06/28/14 0400 06/29/14 0450  WBC 9.2 9.2  HGB 14.5 14.3  HCT 43.5 43.7  PLT 240 272    Recent Labs  06/29/14 0450 06/30/14 0500  NA 143 146*  K 3.5 3.9  CL  107 110  CO2 26 24  GLUCOSE 162* 131*  BUN 35* 31*  CREATININE 1.20 1.11  CALCIUM 8.3* 9.0    Medications:  I have reviewed the patient's current medications. Scheduled: . ampicillin-sulbactam (UNASYN) IV  3 g Intravenous 4 times per day  . antiseptic oral rinse  7 mL Mouth Rinse QID  . chlorhexidine  15 mL Mouth Rinse BID  . Chlorhexidine Gluconate Cloth  6 each Topical Q0600  . famotidine (PEPCID) IV  20 mg Intravenous Q12H  . hydrALAZINE  20 mg Intravenous 3 times per day  . HYDROmorphone PCA 0.3 mg/mL   Intravenous 6 times per day   YKZ:[id number], diphenhydrAMINE **OR** diphenhydrAMINE, LORazepam, naloxone **AND** sodium chloride, ondansetron (ZOFRAN) IV, ondansetron (ZOFRAN) IV  Assessment/Plan: Pt's supraglottic edema has significantly improved. Both vocal cords are visualized. Trach removed without difficulty. Proceed with swallowing study.Continue abx.   LOS: 5 days   Bran Aldridge,SUI W 06/30/2014, 11:36 AM

## 2014-06-30 NOTE — Evaluation (Signed)
Clinical/Bedside Swallow Evaluation Patient Details  Name: Dakota Jackson MRN: [id number] Date of Birth: May 15, 1965  Today's Date: 06/30/2014 Time: SLP Start Time (ACUTE ONLY): 1506 SLP Stop Time (ACUTE ONLY): 1555 SLP Time Calculation (min) (ACUTE ONLY): 49 min  Past Medical History:  Past Medical History  Diagnosis Date  . Diverticulosis   . Hypertension     no meds  . Arthritis     bilateral shoulders  . Hemorrhoids   . Tubular adenoma of colon 03/2012   Past Surgical History:  Past Surgical History  Procedure Laterality Date  . Circumcision  04/17/2012    Procedure: CIRCUMCISION ADULT;  Surgeon: Hanley Ben, MD;  Location: [facility];  Service: Urology;  Laterality: N/A;  . Tracheostomy tube placement N/A 06/25/2014    Procedure: , attempted fiveroptic intubation;  Surgeon: Leta Baptist, MD;  Location: [facility];  Service: ENT;  Laterality: N/A;   HPI:  49 yo male adm to [facility] with respiratory difficulties, pt required vent support and tracheostomy tube - today decannulated.  Swallow evaluation ordered.     Assessment / Plan / Recommendation Clinical Impression  Pt presents with functional oropharyngeal swallow ability without s/s of aspiration.  No indications of pharyngeal residuals and laryngeal elevation palpated at bedside appeared adequate.  Pt denies sensation of pharyngeal residuals or aspiration.  Phonatory strength is functional, pt is expectorating secretions - white tinged that did appear to have darker color toward end of evaluation.  Pt reports these secretions to be a chronic issue since admit with significant improvement (decreased amount and viscocity). Occasional belching noted during intake- pt denies reflux symptoms.    Recommend consider advancement to clear or full liquids to assure tolerance *can not rule out sensory deficits from acute epigottitis.  SLP to follow up to assess tolerance, readiness for dietary advancement.  Using teach back,  reviewed swallow precautions.      Aspiration Risk  Mild    Diet Recommendation Thin liquid (clears)   Liquid Administration via: Cup Medication Administration: Whole meds with liquid (as tolerated) Supervision: Patient able to self feed Compensations: Slow rate;Small sips/bites Postural Changes and/or Swallow Maneuvers: Seated upright 90 degrees;Upright 30-60 min after meal    Other  Recommendations Oral Care Recommendations: Oral care BID   Follow Up Recommendations   (tbd)    Frequency and Duration min 1 x/week  1 week   Pertinent Vitals/Pain Afebrile, decreased      Swallow Study Prior Functional Status   see HHX, pt denies dysphagia    General Date of Onset: 06/30/14 HPI: 49 yo male adm to [facility] with respiratory difficulties, pt required vent support and tracheostomy tube - today decannulated.  Swallow evaluation ordered.   Type of Study: Bedside swallow evaluation Diet Prior to this Study: NPO Temperature Spikes Noted: No Respiratory Status: Room air History of Recent Intubation: No Behavior/Cognition: Alert;Cooperative;Pleasant mood Oral Cavity - Dentition: Adequate natural dentition Self-Feeding Abilities: Able to feed self Patient Positioning: Upright in bed Baseline Vocal Quality: Low vocal intensity;Clear Volitional Cough: Strong Volitional Swallow: Able to elicit    Oral/Motor/Sensory Function Overall Oral Motor/Sensory Function: Appears within functional limits for tasks assessed   Ice Chips Ice chips: Not tested   Thin Liquid Thin Liquid: Within functional limits Presentation: Cup;Self Fed;Straw    Nectar Thick Nectar Thick Liquid: Not tested   Honey Thick Honey Thick Liquid: Not tested   Puree Puree: Within functional limits Presentation: Self Fed;Spoon   Solid   GO  Solid: Within functional limits Presentation: Self Fed;Spoon       Claudie Fisherman, [facility] SLP [phone]

## 2014-07-01 LAB — GLUCOSE, CAPILLARY
GLUCOSE-CAPILLARY: 155 mg/dL — AB (ref 70–99)
GLUCOSE-CAPILLARY: 136 mg/dL — AB (ref 70–99)
GLUCOSE-CAPILLARY: 129 mg/dL — AB (ref 70–99)
Glucose-Capillary: 114 mg/dL — ABNORMAL HIGH (ref 70–99)

## 2014-07-01 LAB — CULTURE, BLOOD (ROUTINE X 2)
Culture: NO GROWTH
Culture: NO GROWTH

## 2014-07-01 MED ORDER — BOOST / RESOURCE BREEZE PO LIQD
1.0000 | Freq: Three times a day (TID) | ORAL | Status: DC
  Administered 2014-07-01 – 2014-07-03 (×4): 1 via ORAL

## 2014-07-01 MED ORDER — ASPIRIN-ACETAMINOPHEN-CAFFEINE 250-250-65 MG PO TABS
1.0000 | ORAL_TABLET | Freq: Four times a day (QID) | ORAL | Status: DC | PRN
  Administered 2014-07-01 – 2014-07-02 (×2): 1 via ORAL
  Filled 2014-07-01 (×3): qty 1

## 2014-07-01 NOTE — Progress Notes (Signed)
Patient ID: Dakota Jackson, male   DOB: [birth year], 49 y.o.   MRN: [id number]  [facility] PROGRESS NOTE  Dakota Jackson TLX:[id number] DOB: [birth year] DOA: 06/24/2014 PCP: Scarlette Calico, MD   Brief narrative:    49 y.o. male with h/o HTN on no medications at home, presented on 4/6 to [facility] ER with sore throat and dysphagia that started one day piror to this admission .  In ED, after an unsuccessful attempt to intubate pt but due to epglottic edema, ENT specialist was called for assistance and also failed. Pt subsequently underwent tracheostomy on 4/6 by Dr. [name] and was placed on ventilator. Extubated on 4/7 and successfully kept on trach collar. Pt continued having trouble with oral intake and has required Panda tube placement which subsequently came out 4/10 with pt's movement. Pt passed swallow evaluation on 4/12 and diet was advanced to regular thin liquids.   SIGNIFICANT EVENTS: 06/25/14: emergent tracheostomy [name])  06/26/14:  fent increased for throat pain 06/27/14:  Has been on ATC since 11:00 on 4/7. Had some throat and trach pain  Assessment/Plan:    Principal Problem:   Sepsis - criteria met on admission with T 99.7 F, HR 53 - 163, SBP in 50's - 200's, VDRF, lactic acid 4.1, WBC 16.6 - secondary to acute epiglottitis  - Unasyn started and today is day #7 - sepsis etiology has now been resolved     Acute respiratory failure with hypoxia, VDRF - requiring intubation from 4/6 - 4/7 and placement on trach collar  - secondary to acute epiglottitis with acute airway obstruction, diffuse bilateral pulmonary infiltrates secondary acute diastolic CHF - status post tracheostomy 4/6 by Dr. [name]  - continues to improve clinically and respiratory status is stable this AM - appreciate PCCM team assistance  - pt on ABX Unasyn, today is day #7/7, will stop after today's dose     Acute epiglottitis - ABX Unasyn as noted above and to stop after today's last dose  - pt passed  swallow eval and diet will be advanced to regular thin liquid per SLP recommendations     Acute diastolic CHF  - weight on admission 196 lbs and now trending down to 182 lbs this AM - off IVF - 2 D ECHO with grade I diastolic CHF 09/4161 and with normal EF  - continue to monitor daily weights and strict I's and O's    Accelerated HTN - SBP still in 150's - will continue hydralazine and plan on changing to PO in AM if pt tolerating regular diet well    Acute renal failure secondary to sepsis, pre renal etiology - now resolved and Cr is WNL  - diet advanced to regular     Hypernatremia - secondary to pre renal etiology - again, will advance diet today and hopefully this will help with circulation volume - repeat BMP in AM    Hyperglycemia - will check A1C prior to discharge as fasting CBG's have been > 150's    Non severe PCM - in the context of acute illness - now passed swallow evaluation and diet advanced   DVT prophylaxis - SCD's  Code Status: Full.  Family Communication:  plan of care discussed with the patient Disposition Plan: Home when stable. Possibly in 24 hours.  IV access:  Peripheral IV  Procedures and diagnostic studies:    CT NECK W/C 06/25/2014  Supraglottitis. 2. 8 mm right tonsillar fluid collection.     ABD XRAY  06/25/2014  Feeding tube tip is in the mid stomach.     CXR  06/25/2014  Endotracheal tube tip measures 4.5 cm above the carina. Bilateral perihilar infiltration ? edema vs PNA  CXR  06/26/2014  Interim placement of feeding tube, its tip is in the stomach. Tracheostomy tube in stable position   CXR 06/27/2014   Bilateral perihilar consolidation demonstrating increase in the right lung base.    CXR  06/28/2014  Significant interval improvement in the previously noted bilateral central airspace process likely resolving edema.    Medical Consultants:  ENT PCCM  Other Consultants:  SLP  IAnti-Infectives:   Unasyn 4/6 --> 4/12  Faye Ramsay,  MD  [facility] Pager [phone]  If 7PM-7AM, please contact night-coverage [email or web address] Password [email or web address] 07/01/2014, 2:20 PM   LOS: 6 days   HPI/Subjective: No events overnight.   Objective: Filed Vitals:   06/30/14 1524 06/30/14 2101 06/30/14 2111 07/01/14 0501  BP:  139/75  129/74  Pulse: 120 97  99  Temp:  99.7 F (37.6 C)  99.6 F (37.6 C)  TempSrc:    Oral  Resp: 18   18  Height:      Weight:    82.555 kg (182 lb)  SpO2: 99% 98% 97% 99%    Intake/Output Summary (Last 24 hours) at 07/01/14 1420 Last data filed at 07/01/14 1005  Gross per 24 hour  Intake   1220 ml  Output    200 ml  Net   1020 ml    Exam:   General:  Pt is alert, follows commands appropriately, not in acute distress  Cardiovascular: Regular rate and rhythm, S1/S2, no murmurs, no rubs, no gallops  Respiratory: Clear to auscultation bilaterally, no wheezing, trach collar in place, scattered rhonchi at bases   Abdomen: Soft, non tender, non distended, bowel sounds present, no guarding  Extremities: No edema, pulses DP and PT palpable bilaterally  Data Reviewed: Basic Metabolic Panel:  Recent Labs Lab 06/26/14 0350 06/27/14 0325 06/28/14 0400 06/29/14 0450 06/30/14 0500  NA 141 137 140 143 146*  K 4.7 3.4* 3.3* 3.5 3.9  CL 104 101 101 107 110  CO2 _0 GLUCOSE 152* 159* 178* 162* 131*  BUN 15 19 26* 35* 31*  CREATININE 1.09 0.99 1.32 1.20 1.11  CALCIUM 8.8 8.7 8.7 8.3* 9.0  MG  --   --  2.0  --   --   PHOS  --   --  4.2  --   --    Liver Function Tests:  Recent Labs Lab 06/25/14 1040  AST 43*  ALT 39  ALKPHOS 38*  BILITOT 0.9  PROT 6.3  ALBUMIN 3.3*   CBC:  Recent Labs Lab 06/25/14 1040 06/26/14 0350 06/27/14 0325 06/28/14 0400 06/29/14 0450  WBC 13.5* 16.6* 11.6* 9.2 9.2  NEUTROABS  --   --   --   --  5.6  HGB 13.6 14.6 14.2 14.5 14.3  HCT 40.6 43.4 42.3 43.5 43.7  MCV 88.8 88.8 87.9 87.9 89.0  PLT 182 187 201 240 272   CBG:  Recent Labs Lab  06/30/14 1149 06/30/14 1655 06/30/14 2112 07/01/14 0742 07/01/14 1214  GLUCAP 117* 192* 136* 114* 155*    Recent Results (from the past 240 hour(s))  Rapid strep screen     Status: None   Collection Time: 06/23/14  3:34 PM  Result Value Ref Range Status   Streptococcus, Group A Screen (Direct) NEGATIVE NEGATIVE Final  Culture, Group  A Strep     Status: None   Collection Time: 06/23/14  3:34 PM  Result Value Ref Range Status   Strep A Culture Negative  Final    Comment:   MRSA PCR Screening     Status: Abnormal   Collection Time: 06/25/14  4:08 AM  Result Value Ref Range Status   MRSA by PCR POSITIVE (A) NEGATIVE Final  Culture, blood (routine x 2)     Status: None   Collection Time: 06/25/14  6:15 AM  Result Value Ref Range Status   Specimen Description BLOOD RIGHT HAND  Final   Culture   Final    NO GROWTH 5 DAYS    Report Status 07/01/2014 FINAL  Final  Culture, blood (routine x 2)     Status: None   Collection Time: 06/25/14  6:25 AM  Result Value Ref Range Status   Specimen Description BLOOD LEFT HAND  Final   Special Requests BOTTLES DRAWN AEROBIC AND ANAEROBIC 4CC  Final   Culture   Final    NO GROWTH 5 DAYS Performed at [facility]    Report Status 07/01/2014 FINAL  Final     Scheduled Meds: . ampicillin-sulbactam (UNASYN) IV  3 g Intravenous 4 times per day  . antiseptic oral rinse  7 mL Mouth Rinse QID  . chlorhexidine  15 mL Mouth Rinse BID  . famotidine (PEPCID) IV  20 mg Intravenous Q12H  . feeding supplement (RESOURCE BREEZE)  1 Container Oral TID BM  . hydrALAZINE  20 mg Intravenous 3 times per day   Continuous Infusions:

## 2014-07-01 NOTE — Progress Notes (Signed)
PULMONARY / CRITICAL CARE MEDICINE   Name: Dakota Jackson MRN: [id number] DOB: 05/05/1965    ADMISSION DATE:  06/24/2014 CONSULTATION DATE:  06/25/2014  REFERRING MD :  Dr. [name]  CHIEF COMPLAINT:  Acute epiglottitis  INITIAL PRESENTATION: 76 ypbm  admitted on 06/25/2014 with acute epiglottitis requiring emergent tracheostomy.  STUDIES:  4/05  CT neck> supraglottitis noted, 8 mm right tonsillar fluid collection  SIGNIFICANT EVENTS: 4/06  emergent tracheostomy [name])  06/26/14: fent increased for throat pain 06/27/14: Has been on ATC since 11:00 on 4/7. Had some throat and trach pain o/n   SUBJECTIVE/OVERNIGHT/INTERVAL HX Not able to tolerate trach occlusion, is drinking clears   VITAL SIGNS: Temp:  [98.3 F (36.8 C)-99.7 F (37.6 C)] 99.6 F (37.6 C) (04/12 0501) Pulse Rate:  [97-120] 99 (04/12 0501) Resp:  [18-20] 18 (04/12 0501) BP: (129-140)/(74-79) 129/74 mmHg (04/12 0501) SpO2:  [94 %-99 %] 99 % (04/12 0501) FiO2 (%):  [21 %] 21 % (04/11 1200) Weight:  [82.555 kg (182 lb)] 82.555 kg (182 lb) (04/12 0501)   INTAKE / OUTPUT:  Intake/Output Summary (Last 24 hours) at 07/01/14 0831 Last data filed at 07/01/14 0108  Gross per 24 hour  Intake   1040 ml  Output    200 ml  Net    840 ml    PHYSICAL EXAMINATION: General: resting comfortably, no distress.  Neuro:  Awake, alert.  MAE.  Oriented X3 HEENT:  Mm pink/moist, no jvd, trach dressing intact. Good phonation Cardiovascular:  s1s2 rrr, no m/r/g  Lungs:  resp's even/non-labored, minimal scattered rhonchi that cll w/ cough Abdomen:  Round/soft, bsx4 active  Musculoskeletal:  No acute deformities  Skin:  Warm/dry, no edema   LABS:  Recent Labs Lab 06/27/14 0325 06/28/14 0400 06/29/14 0450  HGB 14.2 14.5 14.3  HCT 42.3 43.5 43.7  WBC 11.6* 9.2 9.2  PLT 201 240 272    Recent Labs Lab 06/26/14 0350 06/27/14 0325 06/28/14 0400 06/29/14 0450 06/30/14 0500  NA 141 137 140 143 146*  K 4.7 3.4* 3.3*  3.5 3.9  CL 104 101 101 107 110  CO2 26 26 28 26 24   GLUCOSE 152* 159* 178* 162* 131*  BUN 15 19 26* 35* 31*  CREATININE 1.09 0.99 1.32 1.20 1.11  CALCIUM 8.8 8.7 8.7 8.3* 9.0  MG  --   --  2.0  --   --   PHOS  --   --  4.2  --   --    Estimated Creatinine Clearance: 85.7 mL/min (by C-G formula based on Cr of 1.11).  IMAGING x48h No results found.   ASSESSMENT / PLAN:  Acute epiglottitis w/ acute airway obstruction causing acute respiratory failure> now status post tracheostomy 4/6 ([name]) Diffuse Bilateral Pulmonary Infiltrates - Negative pressure pulmonary edema (most likely source of infiltrates) vs diastolic dysfxn,  infectious etiology vs ALI > improving rapidly so favor neg pressure pulmonary edema  >clinically improved. Now decannulated. Looks [place]! No distress. Good cough and phonation.   Plan:   Complete 7d course abx Adv diet per speech pulm hygiene Mobilize Anticipate ready for dc soon.  We will s/o call if we can assist further   Erick Colace ACNP-BC [place] Pager # [phone] OR # [phone] if no answer

## 2014-07-01 NOTE — Progress Notes (Signed)
NUTRITION FOLLOW-UP  INTERVENTION: Diet advancement per MD Provide Resource Breeze po TID, each supplement provides 250 kcal and 9 grams of protein RD will continue to monitor  NUTRITION DIAGNOSIS: Inadequate oral intake related to inability to eat as evidenced by NPO, now evidenced by clear liquid diet.  Goal: Pt to meet >/= 90% of their estimated nutrition needs, unmet   Monitor:  Diet advancement, weight, labs, I/O's  ASSESSMENT: Pt admitted on 4/6 with acute epiglottitis requiring emergent tracheostomy.  4/11: Per interdisciplinary rounds, Panda tube was pulled out 4/10. Pt eager to [place].  SLP eval pending. Pt tolerating some clear liquids.   4/12: -SLP following, recommend clear or full liquids. Will continue to follow for diet advancement. - Pt states his appetite is "funny". He had some jello this morning and is planning on ordering something for lunch but isn't sure what he wants to order.  -Pt is willing to try Resource Breeze supplements while he is on clear liquids. RD to order.  Labs reviewed: Elevated Na  Height: Ht Readings from Last 1 Encounters:  06/28/14 5\' 11"  (1.803 m)    Weight: Wt Readings from Last 1 Encounters:  07/01/14 182 lb (82.555 kg)  06/30/14 179 lb  BMI:  Body mass index is 25.4 kg/(m^2).  Re-estimated Nutritional Needs: Kcal: 2100-2300 Protein: 115-125 g Fluid: >2 L/day  Skin: intact  Diet Order: Diet clear liquid Room service appropriate?: Yes; Fluid consistency:: Thin   Intake/Output Summary (Last 24 hours) at 07/01/14 1048 Last data filed at 07/01/14 1005  Gross per 24 hour  Intake   1520 ml  Output    200 ml  Net   1320 ml    Last BM: 4/10  Labs:   Recent Labs Lab 06/28/14 0400 06/29/14 0450 06/30/14 0500  NA 140 143 146*  K 3.3* 3.5 3.9  CL 101 107 110  CO2 28 26 24   BUN 26* 35* 31*  CREATININE 1.32 1.20 1.11  CALCIUM 8.7 8.3* 9.0  MG 2.0  --   --   PHOS 4.2  --   --   GLUCOSE 178* 162* 131*     CBG (last 3)   Recent Labs  06/30/14 1655 06/30/14 2112 07/01/14 0742  GLUCAP 192* 136* 114*    Scheduled Meds: . ampicillin-sulbactam (UNASYN) IV  3 g Intravenous 4 times per day  . antiseptic oral rinse  7 mL Mouth Rinse QID  . chlorhexidine  15 mL Mouth Rinse BID  . famotidine (PEPCID) IV  20 mg Intravenous Q12H  . hydrALAZINE  20 mg Intravenous 3 times per day    Continuous Infusions:     Clayton Bibles, MS, RD, LDN Pager: [phone] After Hours Pager: [phone]

## 2014-07-01 NOTE — Progress Notes (Signed)
Speech Language Pathology Treatment: Dysphagia  Patient Details Name: Dakota Jackson MRN: [id number] DOB: 04/16/1965 Today's Date: 07/01/2014 Time: 8119-1478 SLP Time Calculation (min) (ACUTE ONLY): 17 min  Assessment / Plan / Recommendation Clinical Impression  Pt continues to demonstrate improved swallow ability and secretion management today. It was noted pt had jello in his suctioning line/canister - which he reported he expectorated after swallowing.  Pt denies food/drink loss through stoma - air leakage noted with phonation today that is prevented with pt occlusion.    SLP observed pt consuming 4 ounces water, 4 ounces applesauce and 2 saltines.  No indications of aspiration or pharyngeal stasis - no expectoration observed as apparent yesterday.  Suspect secretion expectoration intermittently ongoing.  Recommend advance to regular/thin diet. SLP reviewed aspiration precautions with pt using teach back for reinforcement.    Given jello noted in suction catheter, SLP to follow up one more time to assure tolerance with dietary advancement.    HPI HPI: 49 yo male adm to [facility] with respiratory difficulties, pt required vent support and tracheostomy tube - today decannulated.  Swallow evaluation ordered.     Pertinent Vitals Pain Assessment: No/denies pain  SLP Plan       Recommendations Diet recommendations: Regular;Thin liquid Liquids provided via: Straw Medication Administration: Whole meds with liquid Supervision: Patient able to self feed Compensations: Slow rate;Small sips/bites Postural Changes and/or Swallow Maneuvers: Seated upright 90 degrees;Upright 30-60 min after meal              Oral Care Recommendations: Oral care BID    GO     Luanna Salk, [place] [facility] SLP [phone]

## 2014-07-02 DIAGNOSIS — R070 Pain in throat: Secondary | ICD-10-CM

## 2014-07-02 LAB — BASIC METABOLIC PANEL
ANION GAP: 7 (ref 5–15)
BUN: 14 mg/dL (ref 6–23)
CALCIUM: 8.4 mg/dL (ref 8.4–10.5)
CO2: 26 mmol/L (ref 19–32)
Chloride: 106 mmol/L (ref 96–112)
Creatinine, Ser: 1 mg/dL (ref 0.50–1.35)
GFR calc non Af Amer: 87 mL/min — ABNORMAL LOW (ref >90)
GLUCOSE: 123 mg/dL — AB (ref 70–99)
POTASSIUM: 3.7 mmol/L (ref 3.5–5.1)
Sodium: 139 mmol/L (ref 135–145)

## 2014-07-02 LAB — GLUCOSE, CAPILLARY: Glucose-Capillary: 153 mg/dL — ABNORMAL HIGH (ref 70–99)

## 2014-07-02 LAB — CBC
HEMATOCRIT: 39.3 % (ref 39.0–52.0)
Hemoglobin: 12.7 g/dL — ABNORMAL LOW (ref 13.0–17.0)
MCH: 28.5 pg (ref 26.0–34.0)
MCHC: 32.3 g/dL (ref 30.0–36.0)
MCV: 88.3 fL (ref 78.0–100.0)
PLATELETS: 334 10*3/uL (ref 150–400)
RBC: 4.45 MIL/uL (ref 4.22–5.81)
RDW: 13.5 % (ref 11.5–15.5)
WBC: 7.8 10*3/uL (ref 4.0–10.5)

## 2014-07-02 LAB — BRAIN NATRIURETIC PEPTIDE: B NATRIURETIC PEPTIDE 5: 17.6 pg/mL (ref 0.0–100.0)

## 2014-07-02 MED ORDER — LIDOCAINE VISCOUS 2 % MT SOLN
15.0000 mL | Freq: Four times a day (QID) | OROMUCOSAL | Status: DC | PRN
  Administered 2014-07-02 – 2014-07-03 (×2): 15 mL via OROMUCOSAL
  Filled 2014-07-02 (×3): qty 15

## 2014-07-02 NOTE — Progress Notes (Signed)
[facility] PROGRESS NOTE   Dakota Jackson TDD:[id number] DOB: 10/26/1965 DOA: 06/24/2014 PCP: Scarlette Calico, MD  49 y.o. male with h/o HTN on no medications at home, presented on 4/6 to [facility] ER with sore throat and dysphagia that started one day piror to this admission .  In ED, after an unsuccessful attempt to intubate pt but due to epglottic edema, ENT specialist was called for assistance and also failed. Pt subsequently underwent tracheostomy on 4/6 by Dr. [name] and was placed on ventilator. Extubated on 4/7 and successfully kept on trach collar. Pt continued having trouble with oral intake and has required Panda tube placement which subsequently came out 4/10 with pt's movement. Pt passed swallow evaluation on 4/12 and diet was advanced to regular thin liquids.   SIGNIFICANT EVENTS: 06/25/14: emergent tracheostomy [name])  06/26/14: fent increased for throat pain 06/27/14: Has been on ATC since 11:00 on 4/7. Had some throat and trach pain  HPI/Subjective: Seen with significant other at bedside, denies any pain or shortness of breath. Not able to eat because of swallowing issues, complains of food hurts when he swallows.  Assessment/Plan: Principal Problem:   Acute respiratory failure with hypoxia Active Problems:   Epiglottitis   Throat swelling   Pulmonary edema   Throat pain in adult   Tracheostomy status    Sepsis - Criteria met on admission with T 99.7 F, HR 53 - 163, SBP in 50's - 200's, VDRF, lactic acid 4.1, WBC 16.6 - Secondary to acute epiglottitis  - Unasyn started and today is day #8 - Sepsis physiology has now been resolved    Acute respiratory failure with hypoxia, VDRF - Required mechanical ventilation from 4/6-4/7 through tracheostomy. - secondary to acute epiglottitis with acute airway obstruction, diffuse bilateral pulmonary infiltrates secondary acute diastolic CHF - status post tracheostomy 4/6 by Dr. [name]  - continues to improve clinically and  respiratory status is stable this AM - Not currently on oxygen, acute respiratory failure resolved.   Acute epiglottitis - ABX Unasyn as noted above and to stop after today's last dose  - pt passed swallow eval and diet will be advanced to regular thin liquid per SLP recommendations  - Still has some dysphagia, we use lidocaine viscous, downgrade diet to soft mechanical.   Acute diastolic CHF  - weight on admission 196 lbs and now trending down to 182 lbs this AM - off IVF - 2 D ECHO with grade I diastolic CHF 08/2374 and with normal EF  - continue to monitor daily weights and strict I's and O's   Accelerated HTN - SBP still in 150's - Control hypertension was oral medications.   Acute renal failure secondary to sepsis, pre renal etiology - now resolved and Cr is WNL  - diet advanced to regular    Hypernatremia - This is resolved.    Hyperglycemia - Check hemoglobin A1c   Non severe PCM - in the context of acute illness - now passed swallow evaluation and diet advanced   Code Status: Full Code Family Communication: Plan discussed with the patient. Disposition Plan: Remains inpatient Diet: Diet regular Room service appropriate?: Yes; Fluid consistency:: Thin  Consultants:  Was under PCCM.  ENT  Procedures:  Emergent tracheostomy  Antibiotics:  None currently   Objective: Filed Vitals:   07/02/14 0603  BP: 137/77  Pulse: 93  Temp: 98.7 F (37.1 C)  Resp: 18    Intake/Output Summary (Last 24 hours) at 07/02/14 1139 Last data filed at 07/02/14  9892  Gross per 24 hour  Intake   1220 ml  Output   1100 ml  Net    120 ml   Filed Weights   06/30/14 0500 07/01/14 0501 07/02/14 0603  Weight: 81.194 kg (179 lb) 82.555 kg (182 lb) 83.915 kg (185 lb)    Exam: General: Alert and awake, oriented x3, not in any acute distress. HEENT: anicteric sclera, pupils reactive to light and accommodation, EOMI CVS: S1-S2 clear, no murmur rubs or  gallops Chest: clear to auscultation bilaterally, no wheezing, rales or rhonchi Abdomen: soft nontender, nondistended, normal bowel sounds, no organomegaly Extremities: no cyanosis, clubbing or edema noted bilaterally Neuro: Cranial nerves II-XII intact, no focal neurological deficits  Data Reviewed: Basic Metabolic Panel:  Recent Labs Lab 06/27/14 0325 06/28/14 0400 06/29/14 0450 06/30/14 0500 07/02/14 0410  NA 137 140 143 146* 139  K 3.4* 3.3* 3.5 3.9 3.7  CL 101 101 107 110 106  CO2 _0 GLUCOSE 159* 178* 162* 131* 123*  BUN 19 26* 35* 31* 14  CREATININE 0.99 1.32 1.20 1.11 1.00  CALCIUM 8.7 8.7 8.3* 9.0 8.4  MG  --  2.0  --   --   --   PHOS  --  4.2  --   --   --    Liver Function Tests: No results for input(s): AST, ALT, ALKPHOS, BILITOT, PROT, ALBUMIN in the last 168 hours. No results for input(s): LIPASE, AMYLASE in the last 168 hours. No results for input(s): AMMONIA in the last 168 hours. CBC:  Recent Labs Lab 06/26/14 0350 06/27/14 0325 06/28/14 0400 06/29/14 0450 07/02/14 0410  WBC 16.6* 11.6* 9.2 9.2 7.8  NEUTROABS  --   --   --  5.6  --   HGB 14.6 14.2 14.5 14.3 12.7*  HCT 43.4 42.3 43.5 43.7 39.3  MCV 88.8 87.9 87.9 89.0 88.3  PLT 187 201 240 272 334   Cardiac Enzymes: No results for input(s): CKTOTAL, CKMB, CKMBINDEX, TROPONINI in the last 168 hours. BNP (last 3 results)  Recent Labs  07/02/14 0410  BNP 17.6    ProBNP (last 3 results) No results for input(s): PROBNP in the last 8760 hours.  CBG:  Recent Labs Lab 07/01/14 0742 07/01/14 1214 07/01/14 1704 07/01/14 2153 07/02/14 0750  GLUCAP 114* 155* 136* 129* 153*    Micro Recent Results (from the past 240 hour(s))  Rapid strep screen     Status: None   Collection Time: 06/23/14  3:34 PM  Result Value Ref Range Status   Streptococcus, Group A Screen (Direct) NEGATIVE NEGATIVE Final    Comment: (NOTE) A Rapid Antigen test may result negative if the antigen level in  the sample is below the detection level of this test. The FDA has not cleared this test as a stand-alone test therefore the rapid antigen negative result has reflexed to a Group A Strep culture.   Culture, Group A Strep     Status: None   Collection Time: 06/23/14  3:34 PM  Result Value Ref Range Status   Strep A Culture Negative  Final    Comment: (NOTE) Performed At: [facility] [place], [place] [id number] Lindon Romp MD [id number]   MRSA PCR Screening     Status: Abnormal   Collection Time: 06/25/14  4:08 AM  Result Value Ref Range Status   MRSA by PCR POSITIVE (A) NEGATIVE Final    Comment:  The GeneXpert MRSA Assay (FDA approved for NASAL specimens only), is one component of a comprehensive MRSA colonization surveillance program. It is not intended to diagnose MRSA infection nor to guide or monitor treatment for MRSA infections. RESULT CALLED TO, READ BACK BY AND VERIFIED WITH: S. EARLY RN AT 0555 ON 04.06.16 BY SHUEA   Culture, blood (routine x 2)     Status: None   Collection Time: 06/25/14  6:15 AM  Result Value Ref Range Status   Specimen Description BLOOD RIGHT HAND  Final   Special Requests BOTTLES DRAWN AEROBIC ONLY [place]  Final   Culture   Final    NO GROWTH 5 DAYS Performed at [facility]    Report Status 07/01/2014 FINAL  Final  Culture, blood (routine x 2)     Status: None   Collection Time: 06/25/14  6:25 AM  Result Value Ref Range Status   Specimen Description BLOOD LEFT HAND  Final   Special Requests BOTTLES DRAWN AEROBIC AND ANAEROBIC 4CC  Final   Culture   Final    NO GROWTH 5 DAYS Performed at [facility]    Report Status 07/01/2014 FINAL  Final     Studies: No results found.  Scheduled Meds: . antiseptic oral rinse  7 mL Mouth Rinse QID  . chlorhexidine  15 mL Mouth Rinse BID  . famotidine (PEPCID) IV  20 mg Intravenous Q12H  . feeding supplement (RESOURCE BREEZE)  1 Container Oral  TID BM  . hydrALAZINE  20 mg Intravenous 3 times per day   Continuous Infusions:      Time spent: 35 minutes    [facility]  [facility] Pager [phone] If 7PM-7AM, please contact night-coverage at [email or web address], password [facility] 07/02/2014, 11:39 AM  LOS: 7 days

## 2014-07-02 NOTE — Progress Notes (Addendum)
NUTRITION FOLLOW-UP  INTERVENTION: -Changed diet to Dysphagia 3 diet instead of GI soft diet -Continue Resource Breeze po TID, each supplement provides 250 kcal and 9 grams of protein -Placed pt on meal order with assist  -RD will continue to monitor  NUTRITION DIAGNOSIS: Inadequate oral intake related to inability to eat as evidenced by NPO, now evidenced by clear liquid diet.  Now related to throat pain as evidenced by pt eating <25% of meals today.  Goal: Pt to meet >/= 90% of their estimated nutrition needs, unmet   Monitor:  Diet advancement, weight, labs, I/O's  ASSESSMENT: Pt admitted on 4/6 with acute epiglottitis requiring emergent tracheostomy.  4/11: Per interdisciplinary rounds, Panda tube was pulled out 4/10. Pt eager to [place].  SLP eval pending. Pt tolerating some clear liquids.   4/12: -SLP following, recommend clear or full liquids. Will continue to follow for diet advancement. - Pt states his appetite is "funny". He had some jello this morning and is planning on ordering something for lunch but isn't sure what he wants to order.  -Pt is willing to try Resource Breeze supplements while he is on clear liquids. RD to order.  4/13: -Pt passed swallow eval. -Pt c/o pain with swallowing. MD recommends mechanical soft diet. Pt was ordered a GI soft diet. Downgraded diet to dysphagia 3. -Breakfast tray was still in room during visit. Pt reports only eating a banana this AM.  -Pt states he is feeling hungry. RD ordered a lunch tray for pt as pt is having a hard time speaking. RD to place pt on meal order with assist to make sure he is able to order his meals. -Pt with Resource Breeze sitting on counter. Pt states he likes them but hasn't drank any today. Encouraged pt to keep sipping on supplement.  Labs reviewed: Elevated Na  Height: Ht Readings from Last 1 Encounters:  06/28/14 5\' 11"  (1.803 m)    Weight: Wt Readings from Last 1 Encounters:  07/02/14 185  lb (83.915 kg)  06/30/14 179 lb  BMI:  Body mass index is 25.81 kg/(m^2).  Re-estimated Nutritional Needs: Kcal: 2100-2300 Protein: 115-125 g Fluid: >2 L/day  Skin: intact  Diet Order: Diet regular Room service appropriate?: Yes; Fluid consistency:: Thin   Intake/Output Summary (Last 24 hours) at 07/02/14 1111 Last data filed at 07/02/14 0649  Gross per 24 hour  Intake   1220 ml  Output   1100 ml  Net    120 ml    Last BM: 4/12  Labs:   Recent Labs Lab 06/28/14 0400 06/29/14 0450 06/30/14 0500 07/02/14 0410  NA 140 143 146* 139  K 3.3* 3.5 3.9 3.7  CL 101 107 110 106  CO2 28 26 24 26   BUN 26* 35* 31* 14  CREATININE 1.32 1.20 1.11 1.00  CALCIUM 8.7 8.3* 9.0 8.4  MG 2.0  --   --   --   PHOS 4.2  --   --   --   GLUCOSE 178* 162* 131* 123*    CBG (last 3)   Recent Labs  07/01/14 1704 07/01/14 2153 07/02/14 0750  GLUCAP 136* 129* 153*    Scheduled Meds: . antiseptic oral rinse  7 mL Mouth Rinse QID  . chlorhexidine  15 mL Mouth Rinse BID  . famotidine (PEPCID) IV  20 mg Intravenous Q12H  . feeding supplement (RESOURCE BREEZE)  1 Container Oral TID BM  . hydrALAZINE  20 mg Intravenous 3 times per day  Continuous Infusions:     Clayton Bibles, MS, RD, LDN Pager: [phone] After Hours Pager: [phone]

## 2014-07-03 DIAGNOSIS — E119 Type 2 diabetes mellitus without complications: Secondary | ICD-10-CM

## 2014-07-03 LAB — HEMOGLOBIN A1C
HEMOGLOBIN A1C: 6.6 % — AB (ref 4.8–5.6)
MEAN PLASMA GLUCOSE: 143 mg/dL

## 2014-07-03 LAB — GLUCOSE, CAPILLARY: Glucose-Capillary: 100 mg/dL — ABNORMAL HIGH (ref 70–99)

## 2014-07-03 NOTE — Discharge Summary (Signed)
Physician Discharge Summary  NGHIA MCENTEE ZHY:[id number] DOB: [birth year] DOA: 06/24/2014  PCP: Scarlette Calico, MD  Admit date: 06/24/2014 Discharge date: 07/03/2014  Time spent: 40 minutes  Recommendations for Outpatient Follow-up:  1. Follow-up with Dr. [name] on 1420 08/08/2014 at 1:20 PM. Patient decannulated, follow-up for further recommendation. 2. Follow-up with Dr. Ronnald Ramp in 1-2 weeks.  Discharge Diagnoses:  Principal Problem:   Acute respiratory failure with hypoxia Active Problems:   Epiglottitis   Throat swelling   Pulmonary edema   Throat pain in adult   Tracheostomy status   DM type 2 (diabetes mellitus, type 2)   Discharge Condition: Stable  Diet recommendation: Heart healthy  Filed Weights   07/01/14 0501 07/02/14 0603 07/03/14 0700  Weight: 82.555 kg (182 lb) 83.915 kg (185 lb) 83.462 kg (184 lb)    History of present illness:  This is a 49 year old male who was admitted on 06/24/2014 from the [facility] emergency department for evaluation of acute epiglottitis. History could not be obtained because he was sedated and on the ventilator at the time of pulmonary in critical care medicine evaluation. Per chart review it appears that the patient presented with acute sore throat and difficulty breathing. He was taken to the operating room for intubation and an awake intubation could not be performed so he had an emergent tracheostomy. Pulmonary and critical care medicine was consulted for vent management.  Wife reports patient was in his usual state of health on Monday 4/4 and went to work. After lunch on Monday, he began having difficulty swallowing. He went to the urgent care and was diagnosed with pharyngitis. He developed worsening difficulty swallowing and sought care in ER as above.   Hospital Course:     Sepsis - Criteria met on admission with T 99.7 F, HR 53 - 163, SBP in 50's - 200's, VDRF, lactic acid 4.1, WBC 16.6 - Secondary to acute epiglottitis   - Patient received Unasyn for 8 days. - Sepsis physiology resolved    Acute respiratory failure with hypoxia, VDRF - Required mechanical ventilation from 4/6-4/7 through tracheostomy. - secondary to acute epiglottitis with acute airway obstruction, diffuse bilateral pulmonary infiltrates secondary acute diastolic CHF - status post tracheostomy 4/6 by Dr. [name]  - continues to improve clinically and respiratory status is stable this AM - Not currently on oxygen, acute respiratory failure resolved.   Acute epiglottitis - ABX Unasyn as noted above and to stop after today's last dose  - pt passed swallow eval and diet will be advanced to regular thin liquid per SLP recommendations  - Patient feels okay, tolerated soft diet very well.   Acute diastolic CHF  - 2 D ECHO with grade I diastolic CHF 04/9526 and with normal EF  - Patient did have some shortness of breath attributed to mild acute diastolic CHF. Likely from iatrogenic IVF overload   Accelerated HTN - SBP still in 150's - Patient has hypertension, not taking medications at home, discharge or medications. - Patient to follow-up with PCP, blood pressure on discharge is 413K for systolic.   Acute renal failure secondary to sepsis, pre renal etiology - now resolved and Cr is WNL  - diet advanced to regular    Hypernatremia - This is resolved.    Non severe PCM - in the context of acute illness - now passed swallow evaluation and diet advanced.  Diabetes mellitus type 2 - Prior to discharge hemoglobin A1c came back elevated at 6.6, this is consistent with diabetes. -  This is discussed with the patient, he'll try to control blood sugar with carbohydrate modified diet. - No hypoglycemic agents started, patient to follow-up with his primary care physician, patient probably needs dilated eye exam as outpatient as new diagnosis of diabetes mellitus.   Procedures:  06/25/2014 emergent tracheostomy, place on  ventilator.  Taken off of the ventilator on 06/26/14  Decannulated on for 02/08/2015  Consultations:  Was under PCCM.  ENT  Discharge Exam: Filed Vitals:   07/03/14 0700  BP: 137/75  Pulse: 85  Temp: 98.5 F (36.9 C)  Resp: 16   General: Alert and awake, oriented x3, not in any acute distress. HEENT: anicteric sclera, pupils reactive to light and accommodation, EOMI CVS: S1-S2 clear, no murmur rubs or gallops Chest: clear to auscultation bilaterally, no wheezing, rales or rhonchi Abdomen: soft nontender, nondistended, normal bowel sounds, no organomegaly Extremities: no cyanosis, clubbing or edema noted bilaterally Neuro: Cranial nerves II-XII intact, no focal neurological deficits  Discharge Instructions   Discharge Instructions    Diet - low sodium heart healthy    Complete by:  As directed      Increase activity slowly    Complete by:  As directed           Current Discharge Medication List    STOP taking these medications     ibuprofen (ADVIL,MOTRIN) 200 MG tablet        No Known Allergies Follow-up Information    Follow up with Ascencion Dike, MD On 07/14/2014.   Specialty:  Otolaryngology   Why:  @ 1:20 PM   Contact information:   [place] [address] [phone]        The results of significant diagnostics from this hospitalization (including imaging, microbiology, ancillary and laboratory) are listed below for reference.    Significant Diagnostic Studies: Ct Soft Tissue Neck W Contrast  06/25/2014   CLINICAL DATA:  Throat swelling. Leukocytosis and recent pharyngitis diagnosis.  EXAM: CT NECK WITH CONTRAST  TECHNIQUE: Multidetector CT imaging of the neck was performed using the standard protocol following the bolus administration of intravenous contrast.  CONTRAST:  81m OMNIPAQUE IOHEXOL 300 MG/ML  SOLN  COMPARISON:  None.  FINDINGS: Pharynx and larynx: There is marked low-density thickening of the supraglottic larynx,  especially of the epiglottis and aryepiglottic folds. There is narrowing of the supraglottic larynx, but no critical stenosis at this time. The true vocal folds appear normal, as does the infraglottic airway. There is parapharyngeal edema, greater on the right, but no retropharyngeal edema or abscess. This parapharyngeal fluid, leukocytosis, and history is more consistent with infection than angioedema. 7 mm peripherally enhancing collection within the right tonsillar fossa consistent with small abscess.  Salivary glands: Negative  Thyroid: Negative  Lymph nodes: Negative  Vascular: Negative  Limited intracranial: Negative  Visualized orbits: Negative  Mastoids and visualized paranasal sinuses: Negative  Skeleton: Negative  Upper chest: No pneumonia.  Critical Value/emergent results were called by telephone at the time of interpretation on 06/25/2014 at 12:10 am to Dr. BEvelina Bucy, who verbally acknowledged these results.  IMPRESSION: 1. Supraglottitis. 2. 8 mm right tonsillar fluid collection.   Electronically Signed   By: JMonte FantasiaM.D.   On: 06/25/2014 00:13   Dg Chest Port 1 View  06/28/2014   CLINICAL DATA:  Acute respiratory failure.  EXAM: PORTABLE CHEST - 1 VIEW  COMPARISON:  06/27/2014 and 06/26/2014  FINDINGS: Tracheostomy tube and enteric tube unchanged. Lungs are  hypoinflated in demonstrates significant interval improvement in the previously noted bilateral central airspace opacification likely resolving edema with minimal residual hazy density over the right midlung. No evidence of effusion or pneumothorax. Cardiomediastinal silhouette and remainder of the exam is unchanged.  IMPRESSION: Significant interval improvement in the previously noted bilateral central airspace process likely resolving edema with minimal residual density in the right midlung.  Tubes and lines unchanged.   Electronically Signed   By: Marin Olp M.D.   On: 06/28/2014 07:56   Dg Chest Port 1 View  06/27/2014    CLINICAL DATA:  Pulmonary edema with increasing shortness of breath.  EXAM: PORTABLE CHEST - 1 VIEW  COMPARISON:  06/26/2014  FINDINGS: Shallow inspiration. Tracheostomy tube and feeding tube are unchanged in position. Persistent bilateral perihilar consolidation or edema. Increasing opacity demonstrated in the right lung base since prior study. This suggest progression. No blunting of costophrenic angles. No pneumothorax. No change since prior study.  IMPRESSION: Bilateral perihilar consolidation demonstrating increase in the right lung base.   Electronically Signed   By: Lucienne Capers M.D.   On: 06/27/2014 05:11   Dg Chest Port 1 View  06/26/2014   CLINICAL DATA:  Respiratory failure.  EXAM: PORTABLE CHEST - 1 VIEW  COMPARISON:  06/25/2014.  FINDINGS: Tracheostomy tube in stable position. Interval placement of feeding tube, is tip is projected over the stomach. Diffuse bilateral pulmonary infiltrates are again noted. These are particularly prominent over the upper lobes. Stable cardiomegaly. No pleural effusion or pneumothorax.  IMPRESSION: 1. Interim placement of feeding tube, its tip is in the stomach. Tracheostomy tube in stable position 2. Persistent bilateral pulmonary infiltrates, protrudes in the upper lobes. No interim change. 3. Stable cardiomegaly.   Electronically Signed   By: Marcello Moores  Register   On: 06/26/2014 07:32   Portable Chest Xray  06/25/2014   CLINICAL DATA:  Postop trach placement.  Respiratory failure.  EXAM: PORTABLE CHEST - 1 VIEW  COMPARISON:  None.  FINDINGS: Endotracheal tube placement. Tip measures 4.5 cm above the carinal. Shallow inspiration. Normal heart size and pulmonary vascularity. Bilateral perihilar and upper lobe infiltration could represent edema or pneumonia. No blunting of costophrenic angles. No pneumothorax.  IMPRESSION: Endotracheal tube tip measures 4.5 cm above the carina. Bilateral perihilar infiltration suggesting edema or pneumonia.   Electronically Signed    By: Lucienne Capers M.D.   On: 06/25/2014 04:16   Dg Abd Portable 1v  06/25/2014   CLINICAL DATA:  Initial encounter for feeding tube placement  EXAM: PORTABLE ABDOMEN - 1 VIEW  COMPARISON:  None.  FINDINGS: Limited study in that the left abdomen has not been included on the film. However, the tip of the feeding catheter is visualized and is located in the mid stomach. The bowel gas pattern is normal.  IMPRESSION: Feeding tube tip is in the mid stomach.   Electronically Signed   By: Misty Stanley M.D.   On: 06/25/2014 13:50    Microbiology: Recent Results (from the past 240 hour(s))  Rapid strep screen     Status: None   Collection Time: 06/23/14  3:34 PM  Result Value Ref Range Status   Streptococcus, Group A Screen (Direct) NEGATIVE NEGATIVE Final    Comment: (NOTE) A Rapid Antigen test may result negative if the antigen level in the sample is below the detection level of this test. The FDA has not cleared this test as a stand-alone test therefore the rapid antigen negative result has reflexed to a Group  A Strep culture.   Culture, Group A Strep     Status: None   Collection Time: 06/23/14  3:34 PM  Result Value Ref Range Status   Strep A Culture Negative  Final    Comment: (NOTE) Performed At: [facility] [place], [place] [id number] Lindon Romp MD PR:[id number]   MRSA PCR Screening     Status: Abnormal   Collection Time: 06/25/14  4:08 AM  Result Value Ref Range Status   MRSA by PCR POSITIVE (A) NEGATIVE Final    Comment:        The GeneXpert MRSA Assay (FDA approved for NASAL specimens only), is one component of a comprehensive MRSA colonization surveillance program. It is not intended to diagnose MRSA infection nor to guide or monitor treatment for MRSA infections. RESULT CALLED TO, READ BACK BY AND VERIFIED WITH: S. EARLY RN AT 0555 ON 04.06.16 BY SHUEA   Culture, blood (routine x 2)     Status: None   Collection Time: 06/25/14  6:15 AM   Result Value Ref Range Status   Specimen Description BLOOD RIGHT HAND  Final   Special Requests BOTTLES DRAWN AEROBIC ONLY 6CC  Final   Culture   Final    NO GROWTH 5 DAYS Performed at [facility]    Report Status 07/01/2014 FINAL  Final  Culture, blood (routine x 2)     Status: None   Collection Time: 06/25/14  6:25 AM  Result Value Ref Range Status   Specimen Description BLOOD LEFT HAND  Final   Special Requests BOTTLES DRAWN AEROBIC AND ANAEROBIC 4CC  Final   Culture   Final    NO GROWTH 5 DAYS Performed at [facility]    Report Status 07/01/2014 FINAL  Final     Labs: Basic Metabolic Panel:  Recent Labs Lab 06/27/14 0325 06/28/14 0400 06/29/14 0450 06/30/14 0500 07/02/14 0410  NA 137 140 143 146* 139  K 3.4* 3.3* 3.5 3.9 3.7  CL 101 101 107 110 106  CO2 26 28 26 24 26   GLUCOSE 159* 178* 162* 131* 123*  BUN 19 26* 35* 31* 14  CREATININE 0.99 1.32 1.20 1.11 1.00  CALCIUM 8.7 8.7 8.3* 9.0 8.4  MG  --  2.0  --   --   --   PHOS  --  4.2  --   --   --    Liver Function Tests: No results for input(s): AST, ALT, ALKPHOS, BILITOT, PROT, ALBUMIN in the last 168 hours. No results for input(s): LIPASE, AMYLASE in the last 168 hours. No results for input(s): AMMONIA in the last 168 hours. CBC:  Recent Labs Lab 06/27/14 0325 06/28/14 0400 06/29/14 0450 07/02/14 0410  WBC 11.6* 9.2 9.2 7.8  NEUTROABS  --   --  5.6  --   HGB 14.2 14.5 14.3 12.7*  HCT 42.3 43.5 43.7 39.3  MCV 87.9 87.9 89.0 88.3  PLT 201 240 272 334   Cardiac Enzymes: No results for input(s): CKTOTAL, CKMB, CKMBINDEX, TROPONINI in the last 168 hours. BNP: BNP (last 3 results)  Recent Labs  07/02/14 0410  BNP 17.6    ProBNP (last 3 results) No results for input(s): PROBNP in the last 8760 hours.  CBG:  Recent Labs Lab 07/01/14 0742 07/01/14 1214 07/01/14 1704 07/01/14 2153 07/02/14 0750  GLUCAP 114* 155* 136* 129* 153*       Signed:  Charletta Voight  A  [facility] 07/03/2014, 11:23 AM

## 2014-07-04 NOTE — Progress Notes (Signed)
Discharge summary sent to payer through [employer]  

## 2014-07-08 ENCOUNTER — Encounter: Payer: Self-pay | Admitting: Internal Medicine

## 2014-07-08 ENCOUNTER — Ambulatory Visit (INDEPENDENT_AMBULATORY_CARE_PROVIDER_SITE_OTHER): Payer: BLUE CROSS/BLUE SHIELD | Admitting: Internal Medicine

## 2014-07-08 ENCOUNTER — Other Ambulatory Visit (INDEPENDENT_AMBULATORY_CARE_PROVIDER_SITE_OTHER): Payer: BLUE CROSS/BLUE SHIELD

## 2014-07-08 VITALS — BP 128/84 | HR 78 | Temp 98.5°F | Resp 16 | Wt 191.0 lb

## 2014-07-08 DIAGNOSIS — R208 Other disturbances of skin sensation: Secondary | ICD-10-CM | POA: Diagnosis not present

## 2014-07-08 DIAGNOSIS — E1142 Type 2 diabetes mellitus with diabetic polyneuropathy: Secondary | ICD-10-CM

## 2014-07-08 DIAGNOSIS — R200 Anesthesia of skin: Secondary | ICD-10-CM

## 2014-07-08 LAB — CBC WITH DIFFERENTIAL/PLATELET
BASOS ABS: 0.1 10*3/uL (ref 0.0–0.1)
Basophils Relative: 1.6 % (ref 0.0–3.0)
EOS ABS: 0.1 10*3/uL (ref 0.0–0.7)
Eosinophils Relative: 1.6 % (ref 0.0–5.0)
HEMATOCRIT: 37.6 % — AB (ref 39.0–52.0)
Hemoglobin: 12.7 g/dL — ABNORMAL LOW (ref 13.0–17.0)
LYMPHS ABS: 1.7 10*3/uL (ref 0.7–4.0)
Lymphocytes Relative: 29.6 % (ref 12.0–46.0)
MCHC: 33.7 g/dL (ref 30.0–36.0)
MCV: 85.3 fl (ref 78.0–100.0)
MONOS PCT: 8.2 % (ref 3.0–12.0)
Monocytes Absolute: 0.5 10*3/uL (ref 0.1–1.0)
NEUTROS ABS: 3.5 10*3/uL (ref 1.4–7.7)
Neutrophils Relative %: 59 % (ref 43.0–77.0)
Platelets: 451 10*3/uL — ABNORMAL HIGH (ref 150.0–400.0)
RBC: 4.41 Mil/uL (ref 4.22–5.81)
RDW: 14.3 % (ref 11.5–15.5)
WBC: 5.9 10*3/uL (ref 4.0–10.5)

## 2014-07-08 LAB — BASIC METABOLIC PANEL
BUN: 13 mg/dL (ref 6–23)
CHLORIDE: 106 meq/L (ref 96–112)
CO2: 27 meq/L (ref 19–32)
CREATININE: 1.1 mg/dL (ref 0.40–1.50)
Calcium: 8.9 mg/dL (ref 8.4–10.5)
GFR: 91.32 mL/min (ref >60.00)
GLUCOSE: 98 mg/dL (ref 70–99)
Potassium: 3.9 mEq/L (ref 3.5–5.1)
Sodium: 138 mEq/L (ref 135–145)

## 2014-07-08 LAB — FOLATE: Folate: 10.5 ng/mL (ref >5.9)

## 2014-07-08 LAB — VITAMIN B12: Vitamin B-12: 547 pg/mL (ref 211–911)

## 2014-07-08 LAB — HEMOGLOBIN A1C: Hgb A1c MFr Bld: 6.7 % — ABNORMAL HIGH (ref 4.6–6.5)

## 2014-07-08 NOTE — Progress Notes (Signed)
Pre visit review using our clinic review tool, if applicable. No additional management support is needed unless otherwise documented below in the visit note. 

## 2014-07-08 NOTE — Patient Instructions (Signed)

## 2014-07-08 NOTE — Progress Notes (Signed)
Subjective:    Patient ID: Dakota Jackson, male    DOB: [birth year], 49 y.o.   MRN: [id number]  Diabetes He presents for his initial diabetic visit. He has type 2 diabetes mellitus. His disease course has been stable. There are no hypoglycemic associated symptoms. Associated symptoms include foot paresthesias. Pertinent negatives for diabetes include no blurred vision, no chest pain, no fatigue, no foot ulcerations, no polydipsia, no polyphagia, no polyuria, no visual change, no weakness and no weight loss. There are no hypoglycemic complications. Symptoms are stable. Diabetic complications include peripheral neuropathy. Current diabetic treatment includes diet. He is compliant with treatment all of the time. His weight is stable. He is following a generally healthy diet. Meal planning includes avoidance of concentrated sweets. He participates in exercise intermittently. There is no change in his home blood glucose trend. An ACE inhibitor/angiotensin II receptor blocker is contraindicated. He does not see a podiatrist.     Review of Systems  Constitutional: Negative for fever, chills, weight loss, diaphoresis, appetite change and fatigue.  HENT: Negative.   Eyes: Negative for blurred vision.  Respiratory: Negative.  Negative for cough, choking, chest tightness, shortness of breath and stridor.   Cardiovascular: Negative.  Negative for chest pain, palpitations and leg swelling.  Gastrointestinal: Negative.  Negative for abdominal pain.  Endocrine: Negative.  Negative for polydipsia, polyphagia and polyuria.  Genitourinary: Negative.   Musculoskeletal: Negative.   Skin: Negative.   Allergic/Immunologic: Negative.   Neurological: Negative.  Negative for weakness.  Hematological: Negative.  Negative for adenopathy. Does not bruise/bleed easily.  Psychiatric/Behavioral: Negative.        Objective:   Physical Exam  Constitutional: He is oriented to person, place, and time. He appears  well-developed and well-nourished.  Non-toxic appearance. He does not have a sickly appearance. He does not appear ill. No distress.  HENT:  Head: Normocephalic.  Mouth/Throat: Oropharynx is clear and moist. No oropharyngeal exudate.  Eyes: Conjunctivae are normal. Right eye exhibits no discharge. Left eye exhibits no discharge. No scleral icterus.  Neck: Normal range of motion. Neck supple. No JVD present. No tracheal deviation present. No thyromegaly present.  The trach sight looks good with granulation tissue covering the wound  Cardiovascular: Normal rate, regular rhythm, normal heart sounds and intact distal pulses.  Exam reveals no gallop and no friction rub.   No murmur heard. Pulmonary/Chest: Effort normal and breath sounds normal. No stridor. No respiratory distress. He has no wheezes. He has no rales. He exhibits no tenderness.  Abdominal: Soft. Bowel sounds are normal. He exhibits no distension and no mass. There is no tenderness. There is no rebound and no guarding.  Musculoskeletal: Normal range of motion. He exhibits no edema or tenderness.  Lymphadenopathy:    He has no cervical adenopathy.  Neurological: He is oriented to person, place, and time.  Skin: Skin is warm and dry. No rash noted. He is not diaphoretic. No erythema. No pallor.  Vitals reviewed.    Lab Results  Component Value Date   WBC 7.8 07/02/2014   HGB 12.7* 07/02/2014   HCT 39.3 07/02/2014   PLT 334 07/02/2014   GLUCOSE 123* 07/02/2014   CHOL 157 03/19/2012   TRIG 86.0 03/19/2012   HDL 43.70 03/19/2012   LDLCALC 96 03/19/2012   ALT 39 06/25/2014   AST 43* 06/25/2014   NA 139 07/02/2014   K 3.7 07/02/2014   CL 106 07/02/2014   CREATININE 1.00 07/02/2014   BUN 14 07/02/2014  CO2 26 07/02/2014   TSH 2.80 11/22/2012   PSA 0.50 03/19/2012   HGBA1C 6.6* 07/02/2014       Assessment & Plan:

## 2014-07-09 NOTE — Assessment & Plan Note (Signed)
Exam is normal B12 and folate are WNL This appears to be related to DM and he does not want to treat

## 2014-07-09 NOTE — Assessment & Plan Note (Signed)
He has new onset type 2 DM in the setting of recent severe illness He does not need any meds at this time He agrees to work on his diet and exercise regimens Will recheck his A1C in about 3 months - will treat if indicated For now, he will monitor for s/s of hyperglycemia

## 2014-09-10 ENCOUNTER — Encounter: Payer: Self-pay | Admitting: Internal Medicine

## 2014-09-10 ENCOUNTER — Ambulatory Visit (INDEPENDENT_AMBULATORY_CARE_PROVIDER_SITE_OTHER): Payer: BLUE CROSS/BLUE SHIELD | Admitting: Internal Medicine

## 2014-09-10 ENCOUNTER — Other Ambulatory Visit (INDEPENDENT_AMBULATORY_CARE_PROVIDER_SITE_OTHER): Payer: BLUE CROSS/BLUE SHIELD

## 2014-09-10 ENCOUNTER — Telehealth: Payer: Self-pay | Admitting: Internal Medicine

## 2014-09-10 VITALS — BP 130/90 | HR 76 | Temp 98.4°F | Resp 16 | Ht 71.0 in | Wt 204.5 lb

## 2014-09-10 DIAGNOSIS — E119 Type 2 diabetes mellitus without complications: Secondary | ICD-10-CM | POA: Diagnosis not present

## 2014-09-10 DIAGNOSIS — D510 Vitamin B12 deficiency anemia due to intrinsic factor deficiency: Secondary | ICD-10-CM

## 2014-09-10 DIAGNOSIS — M129 Arthropathy, unspecified: Secondary | ICD-10-CM | POA: Diagnosis not present

## 2014-09-10 DIAGNOSIS — Z0000 Encounter for general adult medical examination without abnormal findings: Secondary | ICD-10-CM

## 2014-09-10 DIAGNOSIS — M19019 Primary osteoarthritis, unspecified shoulder: Secondary | ICD-10-CM

## 2014-09-10 LAB — URINALYSIS, ROUTINE W REFLEX MICROSCOPIC
Bilirubin Urine: NEGATIVE
Hgb urine dipstick: NEGATIVE
KETONES UR: NEGATIVE
Leukocytes, UA: NEGATIVE
Nitrite: NEGATIVE
RBC / HPF: NONE SEEN (ref 0–?)
Specific Gravity, Urine: <=1.005 — AB (ref 1.000–1.030)
Total Protein, Urine: NEGATIVE
Urine Glucose: NEGATIVE
Urobilinogen, UA: 0.2 (ref 0.0–1.0)
pH: 6 (ref 5.0–8.0)

## 2014-09-10 LAB — COMPREHENSIVE METABOLIC PANEL
ALT: 31 U/L (ref 0–53)
AST: 30 U/L (ref 0–37)
Albumin: 4.2 g/dL (ref 3.5–5.2)
Alkaline Phosphatase: 51 U/L (ref 39–117)
BUN: 13 mg/dL (ref 6–23)
CALCIUM: 9.4 mg/dL (ref 8.4–10.5)
CHLORIDE: 102 meq/L (ref 96–112)
CO2: 28 mEq/L (ref 19–32)
CREATININE: 1.29 mg/dL (ref 0.40–1.50)
GFR: 75.93 mL/min (ref >60.00)
Glucose, Bld: 98 mg/dL (ref 70–99)
Potassium: 4.3 mEq/L (ref 3.5–5.1)
Sodium: 137 mEq/L (ref 135–145)
Total Bilirubin: 0.3 mg/dL (ref 0.2–1.2)
Total Protein: 7.4 g/dL (ref 6.0–8.3)

## 2014-09-10 LAB — IBC PANEL
IRON: 65 ug/dL (ref 42–165)
SATURATION RATIOS: 18.1 % — AB (ref 20.0–50.0)
Transferrin: 257 mg/dL (ref 212.0–360.0)

## 2014-09-10 LAB — FERRITIN: Ferritin: 148.7 ng/mL (ref 22.0–322.0)

## 2014-09-10 LAB — CBC WITH DIFFERENTIAL/PLATELET
BASOS ABS: 0.1 10*3/uL (ref 0.0–0.1)
Basophils Relative: 1.6 % (ref 0.0–3.0)
EOS ABS: 0.2 10*3/uL (ref 0.0–0.7)
Eosinophils Relative: 3.7 % (ref 0.0–5.0)
HCT: 45.3 % (ref 39.0–52.0)
HEMOGLOBIN: 15.2 g/dL (ref 13.0–17.0)
Lymphocytes Relative: 49.1 % — ABNORMAL HIGH (ref 12.0–46.0)
Lymphs Abs: 2.3 10*3/uL (ref 0.7–4.0)
MCHC: 33.6 g/dL (ref 30.0–36.0)
MCV: 84.8 fl (ref 78.0–100.0)
MONOS PCT: 10.2 % (ref 3.0–12.0)
Monocytes Absolute: 0.5 10*3/uL (ref 0.1–1.0)
NEUTROS ABS: 1.6 10*3/uL (ref 1.4–7.7)
NEUTROS PCT: 35.4 % — AB (ref 43.0–77.0)
PLATELETS: 277 10*3/uL (ref 150.0–400.0)
RBC: 5.35 Mil/uL (ref 4.22–5.81)
RDW: 14.6 % (ref 11.5–15.5)
WBC: 4.6 10*3/uL (ref 4.0–10.5)

## 2014-09-10 LAB — HEMOGLOBIN A1C: Hgb A1c MFr Bld: 5.8 % (ref 4.6–6.5)

## 2014-09-10 LAB — PSA: PSA: 0.45 ng/mL (ref 0.10–4.00)

## 2014-09-10 LAB — TSH: TSH: 4.99 u[IU]/mL — AB (ref 0.35–4.50)

## 2014-09-10 MED ORDER — MELOXICAM 15 MG PO TABS: 15.0000 mg | ORAL_TABLET | Freq: Every day | ORAL | Status: DC

## 2014-09-10 NOTE — Telephone Encounter (Signed)
Work letter approved , lmovm for pt to pick up

## 2014-09-10 NOTE — Progress Notes (Signed)
Subjective:  Patient ID: Dakota Jackson, male    DOB: 1965/05/17  Age: 49 y.o. MRN: [id number]  CC: Annual Exam; Anemia; and Diabetes   HPI Dakota Jackson presents for follow up and CPX, he complains of shoulder pain.  No outpatient prescriptions prior to visit.   No facility-administered medications prior to visit.    ROS Review of Systems  Constitutional: Negative.  Negative for fever, chills, diaphoresis, appetite change and fatigue.  HENT: Negative.  Negative for dental problem, trouble swallowing and voice change.   Eyes: Negative.   Respiratory: Negative.  Negative for cough, choking, chest tightness, shortness of breath and stridor.   Cardiovascular: Negative.  Negative for chest pain, palpitations and leg swelling.  Gastrointestinal: Negative.  Negative for nausea, vomiting, abdominal pain, diarrhea, constipation and blood in stool.  Endocrine: Negative.   Genitourinary: Negative.   Musculoskeletal: Positive for arthralgias. Negative for myalgias, back pain, joint swelling, gait problem, neck pain and neck stiffness.  Skin: Negative.   Allergic/Immunologic: Negative.   Neurological: Negative.  Negative for dizziness, seizures, syncope, light-headedness, numbness and headaches.  Hematological: Negative.  Negative for adenopathy. Does not bruise/bleed easily.  Psychiatric/Behavioral: Negative.     Objective:  BP 130/90 mmHg  Pulse 76  Temp(Src) 98.4 F (36.9 C) (Oral)  Resp 16  Ht 5\' 11"  (1.803 m)  Wt 204 lb 8 oz (92.761 kg)  BMI 28.53 kg/m2  SpO2 98%  BP Readings from Last 3 Encounters:  09/10/14 130/90  07/08/14 128/84  07/03/14 137/75    Wt Readings from Last 3 Encounters:  09/10/14 204 lb 8 oz (92.761 kg)  07/08/14 191 lb (86.637 kg)  07/03/14 184 lb (83.462 kg)    Physical Exam  Constitutional: He is oriented to person, place, and time. He appears well-developed and well-nourished. No distress.  HENT:  Head: Normocephalic and atraumatic.    Mouth/Throat: Oropharynx is clear and moist. No oropharyngeal exudate.  Eyes: Conjunctivae are normal. Right eye exhibits no discharge. Left eye exhibits no discharge. No scleral icterus.  Neck: Normal range of motion. Neck supple. No JVD present. No tracheal deviation present. No thyromegaly present.  Cardiovascular: Normal rate, regular rhythm, normal heart sounds and intact distal pulses.  Exam reveals no gallop and no friction rub.   No murmur heard. Pulmonary/Chest: Effort normal and breath sounds normal. No stridor. No respiratory distress. He has no wheezes. He has no rales. He exhibits no tenderness.  Abdominal: Soft. Bowel sounds are normal. He exhibits no distension and no mass. There is no tenderness. There is no rebound and no guarding. Hernia confirmed negative in the right inguinal area and confirmed negative in the left inguinal area.  Genitourinary: Rectum normal, testes normal and penis normal. Rectal exam shows no external hemorrhoid, no internal hemorrhoid, no fissure, no mass, no tenderness and anal tone normal. Guaiac negative stool. Prostate is not enlarged and not tender. Right testis shows no mass, no swelling and no tenderness. Right testis is descended. Left testis shows no mass, no swelling and no tenderness. Left testis is descended. Circumcised. No penile erythema or penile tenderness. No discharge found.  Musculoskeletal: Normal range of motion. He exhibits no edema or tenderness.       Right shoulder: Normal. He exhibits normal range of motion, no tenderness, no bony tenderness, no swelling, no effusion, no crepitus, no deformity, no laceration, no pain, no spasm, normal pulse and normal strength.       Left shoulder: Normal. He exhibits normal range  of motion, no tenderness, no bony tenderness, no swelling, no effusion, no crepitus, no deformity, no laceration, no pain, no spasm, normal pulse and normal strength.  Lymphadenopathy:    He has no cervical adenopathy.        Right: No inguinal adenopathy present.       Left: No inguinal adenopathy present.  Neurological: He is oriented to person, place, and time.  Skin: Skin is warm and dry. No rash noted. He is not diaphoretic. No erythema. No pallor.  Psychiatric: He has a normal mood and affect. His behavior is normal. Judgment and thought content normal.  Vitals reviewed.   Lab Results  Component Value Date   WBC 4.6 09/10/2014   HGB 15.2 09/10/2014   HCT 45.3 09/10/2014   PLT 277.0 09/10/2014   GLUCOSE 98 09/10/2014   CHOL 157 03/19/2012   TRIG 86.0 03/19/2012   HDL 43.70 03/19/2012   LDLCALC 96 03/19/2012   ALT 31 09/10/2014   AST 30 09/10/2014   NA 137 09/10/2014   K 4.3 09/10/2014   CL 102 09/10/2014   CREATININE 1.29 09/10/2014   BUN 13 09/10/2014   CO2 28 09/10/2014   TSH 4.99* 09/10/2014   PSA 0.45 09/10/2014   HGBA1C 5.8 09/10/2014    Ct Soft Tissue Neck W Contrast  06/25/2014   CLINICAL DATA:  Throat swelling. Leukocytosis and recent pharyngitis diagnosis.  EXAM: CT NECK WITH CONTRAST  TECHNIQUE: Multidetector CT imaging of the neck was performed using the standard protocol following the bolus administration of intravenous contrast.  CONTRAST:  76mL OMNIPAQUE IOHEXOL 300 MG/ML  SOLN  COMPARISON:  None.  FINDINGS: Pharynx and larynx: There is marked low-density thickening of the supraglottic larynx, especially of the epiglottis and aryepiglottic folds. There is narrowing of the supraglottic larynx, but no critical stenosis at this time. The true vocal folds appear normal, as does the infraglottic airway. There is parapharyngeal edema, greater on the right, but no retropharyngeal edema or abscess. This parapharyngeal fluid, leukocytosis, and history is more consistent with infection than angioedema. 7 mm peripherally enhancing collection within the right tonsillar fossa consistent with small abscess.  Salivary glands: Negative  Thyroid: Negative  Lymph nodes: Negative  Vascular: Negative   Limited intracranial: Negative  Visualized orbits: Negative  Mastoids and visualized paranasal sinuses: Negative  Skeleton: Negative  Upper chest: No pneumonia.  Critical Value/emergent results were called by telephone at the time of interpretation on 06/25/2014 at 12:10 am to Dr. Evelina Bucy , who verbally acknowledged these results.  IMPRESSION: 1. Supraglottitis. 2. 8 mm right tonsillar fluid collection.   Electronically Signed   By: Monte Fantasia M.D.   On: 06/25/2014 00:13   Portable Chest Xray  06/25/2014   CLINICAL DATA:  Postop trach placement.  Respiratory failure.  EXAM: PORTABLE CHEST - 1 VIEW  COMPARISON:  None.  FINDINGS: Endotracheal tube placement. Tip measures 4.5 cm above the carinal. Shallow inspiration. Normal heart size and pulmonary vascularity. Bilateral perihilar and upper lobe infiltration could represent edema or pneumonia. No blunting of costophrenic angles. No pneumothorax.  IMPRESSION: Endotracheal tube tip measures 4.5 cm above the carina. Bilateral perihilar infiltration suggesting edema or pneumonia.   Electronically Signed   By: Lucienne Capers M.D.   On: 06/25/2014 04:16   Dg Abd Portable 1v  06/25/2014   CLINICAL DATA:  Initial encounter for feeding tube placement  EXAM: PORTABLE ABDOMEN - 1 VIEW  COMPARISON:  None.  FINDINGS: Limited study in that the left abdomen  has not been included on the film. However, the tip of the feeding catheter is visualized and is located in the mid stomach. The bowel gas pattern is normal.  IMPRESSION: Feeding tube tip is in the mid stomach.   Electronically Signed   By: Misty Stanley M.D.   On: 06/25/2014 13:50    Assessment & Plan:   Maciej was seen today for annual exam, anemia and diabetes.  Diagnoses and all orders for this visit:  Routine general medical examination at a health care facility - exam done, vaccines were reviewed and updated, labs ordered, pt ed material was given Orders: -     Lipid panel; Future -      Comprehensive metabolic panel; Future -     CBC with Differential/Platelet; Future -     TSH; Future -     PSA; Future -     Urinalysis, Routine w reflex microscopic (not at [facility]); Future  Pernicious anemia - this has resolved Orders: -     IBC panel; Future -     Ferritin; Future  Type 2 diabetes mellitus without complication - his blood sugars are much better and no treatment is required, he will cont to work on his lifestyle modifications Orders: -     Microalbumin / creatinine urine ratio; Future -     Hemoglobin A1c; Future  AC (acromioclavicular) arthritis Orders: -     meloxicam (MOBIC) 15 MG tablet; Take 1 tablet (15 mg total) by mouth daily.  I am having Mr. Rathbun start on meloxicam.  Meds ordered this encounter  Medications  . meloxicam (MOBIC) 15 MG tablet    Sig: Take 1 tablet (15 mg total) by mouth daily.    Dispense:  90 tablet    Refill:  1   See AVS for instructions about healthy living and anticipatory guidance.  Follow-up: Return in about 4 months (around 01/10/2015).  Scarlette Calico, MD

## 2014-09-10 NOTE — Patient Instructions (Signed)

## 2014-09-10 NOTE — Progress Notes (Signed)
Pre visit review using our clinic review tool, if applicable. No additional management support is needed unless otherwise documented below in the visit note. 

## 2014-09-10 NOTE — Telephone Encounter (Signed)
Pt request a note to go back to work on 09/16/14. Please call pt if this ok [phone].

## 2016-04-13 IMAGING — CT CT NECK W/ CM
2 of 3 series · 8 of 14 positions shown, 9 images · IV contrast (OMNIPAQUE 300)
Comparison: None.

CLINICAL DATA: Throat swelling. Leukocytosis and recent pharyngitis
diagnosis.

EXAM:
CT NECK WITH CONTRAST
TECHNIQUE: Multidetector CT imaging of the neck was performed using the
standard protocol following the bolus administration of intravenous
contrast.
CONTRAST:  80mL OMNIPAQUE IOHEXOL 300 MG/ML  SOLN

[Series 3: neck with st · axial · 0.51mm/px · z∈[-316,-154]mm · 4 of 136 slices shown, 5 images]
[im 28/136  soft-tissue]
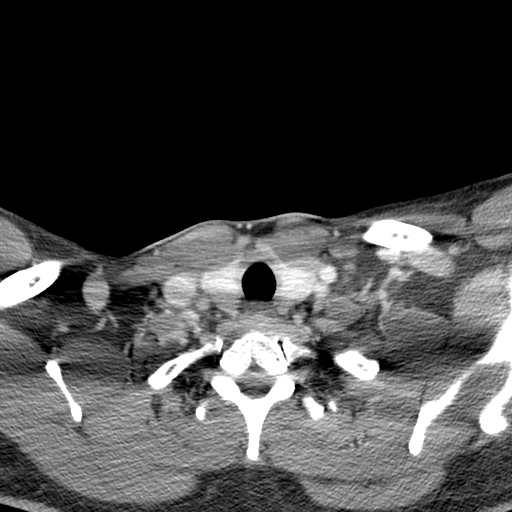
[im 28/136  bone]
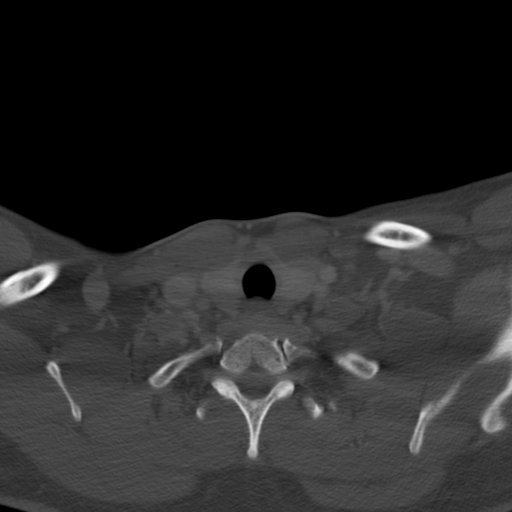
[im 55/136  bone]
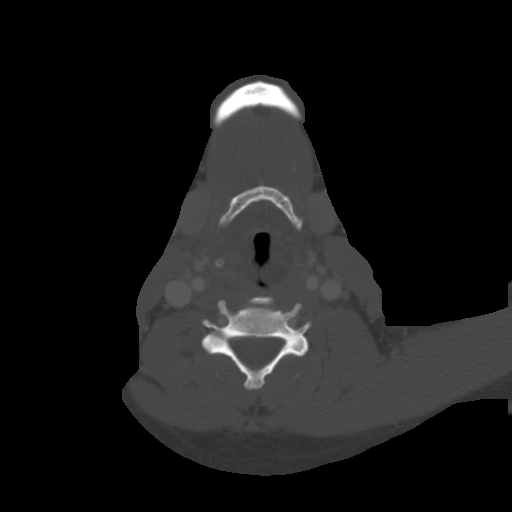
[im 82/136  bone]
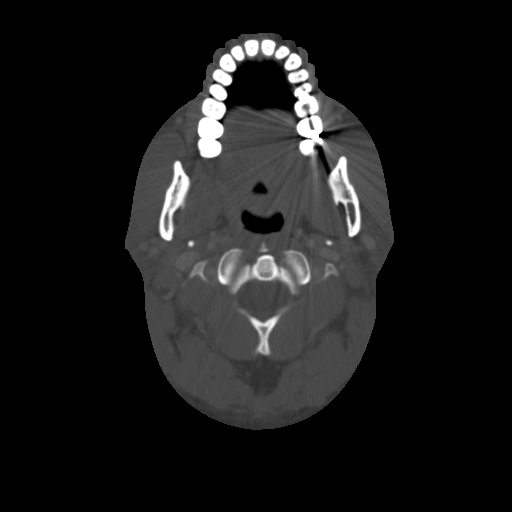
[im 109/136  bone]
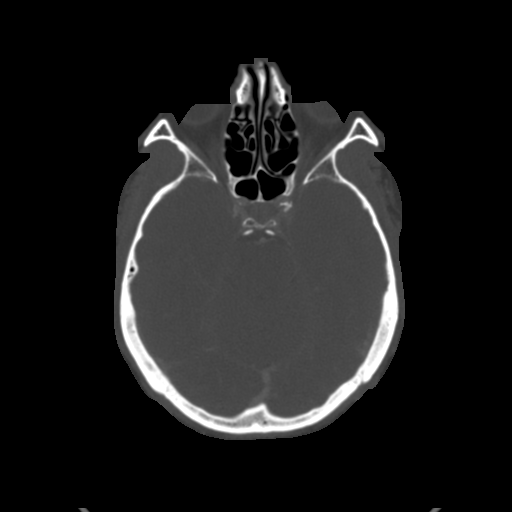

[Series 6: ax axial recons · axial · 0.39mm/px · z∈[-316,-154]mm · 4 of 136 slices shown]
[im 28/136  bone]
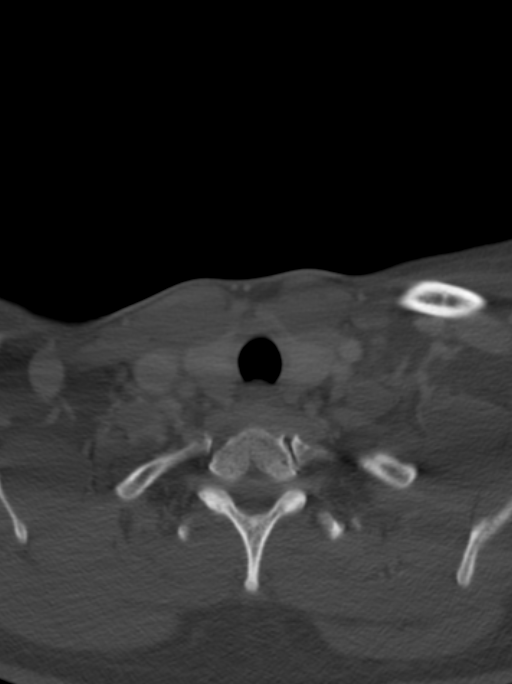
[im 55/136  bone]
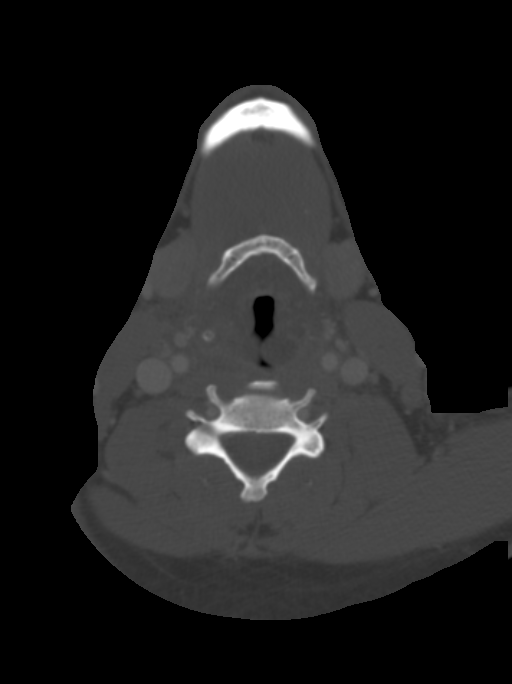
[im 82/136  bone]
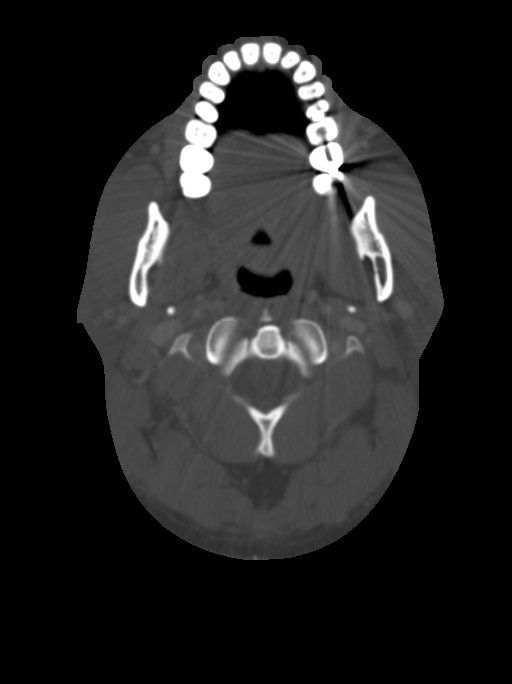
[im 109/136  bone]
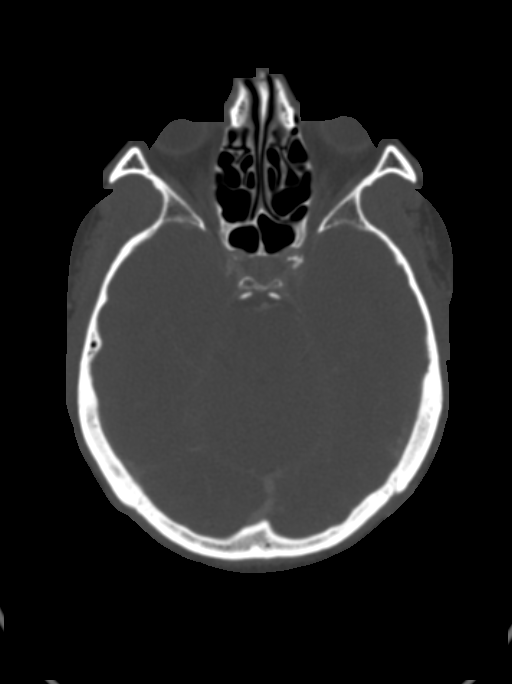

[8 of 14 positions shown; findings below may reference images not displayed]

FINDINGS: Pharynx and larynx: There is marked low-density thickening of the
supraglottic larynx, especially of the epiglottis and aryepiglottic
folds. There is narrowing of the supraglottic larynx, but no
critical stenosis at this time. The true vocal folds appear normal,
as does the infraglottic airway. There is parapharyngeal edema,
greater on the right, but no retropharyngeal edema or abscess. This
parapharyngeal fluid, leukocytosis, and history is more consistent
with infection than angioedema. 7 mm peripherally enhancing
collection within the right tonsillar fossa consistent with small
abscess.

Salivary glands: Negative

Thyroid: Negative

Lymph nodes: Negative

Vascular: Negative

Limited intracranial: Negative

Visualized orbits: Negative

Mastoids and visualized paranasal sinuses: Negative

Skeleton: Negative

Upper chest: No pneumonia.

Critical Value/emergent results were called by telephone at the time
of interpretation on 06/25/2014 at [DATE] to Dr. JAMILLAH ALMEIDA , who
verbally acknowledged these results.
IMPRESSION: 1. Supraglottitis.
2. 8 mm right tonsillar fluid collection.

## 2016-04-14 IMAGING — DX DG CHEST 1V PORT
1 series · 1 of 1 positions shown · non-contrast
Comparison: None.

CLINICAL DATA: Postop trach placement.  Respiratory failure.

EXAM:
PORTABLE CHEST - 1 VIEW

[chest ap]
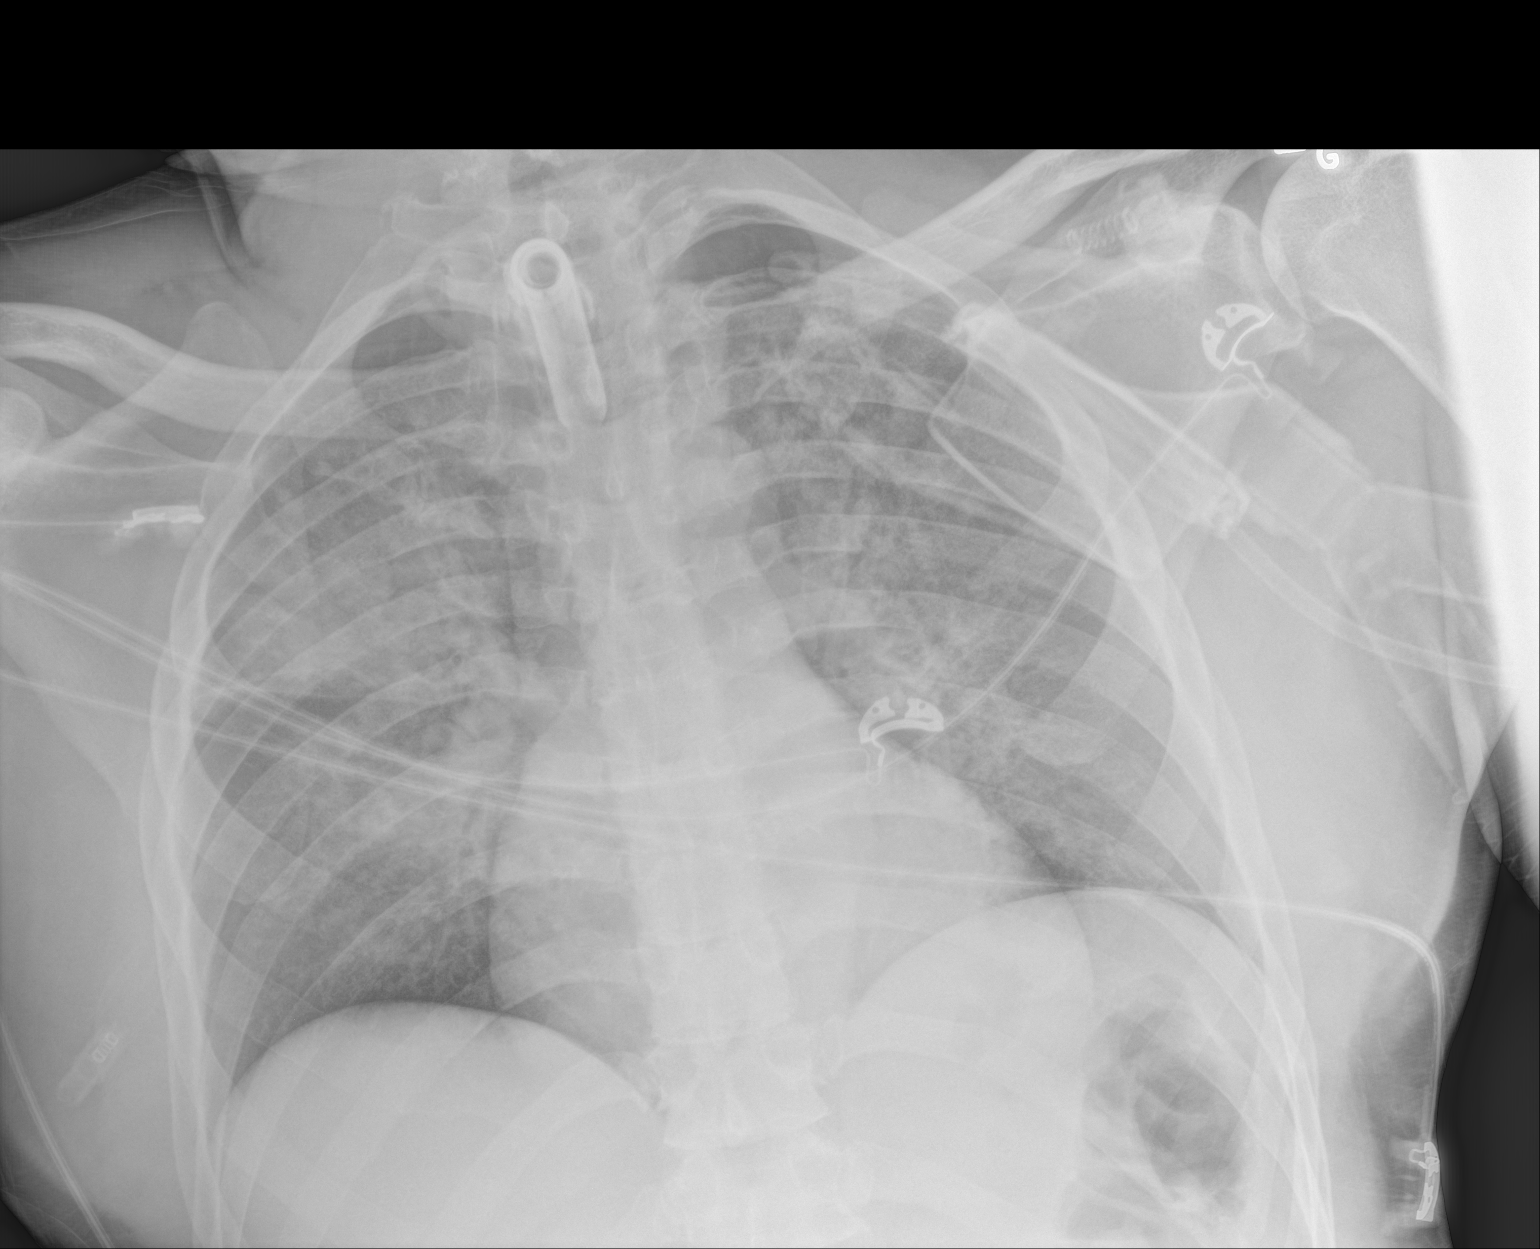

[1 of 1 positions shown; findings below may reference images not displayed]

FINDINGS: Endotracheal tube placement. Tip measures 4.5 cm above the carinal.
Shallow inspiration. Normal heart size and pulmonary vascularity.
Bilateral perihilar and upper lobe infiltration could represent
edema or pneumonia. No blunting of costophrenic angles. No
pneumothorax.
IMPRESSION: Endotracheal tube tip measures 4.5 cm above the carina. Bilateral
perihilar infiltration suggesting edema or pneumonia.

## 2016-04-15 IMAGING — DX DG CHEST 1V PORT
1 series · 1 of 1 positions shown · non-contrast
Comparison: 06/25/2014.

CLINICAL DATA: Respiratory failure.

EXAM:
PORTABLE CHEST - 1 VIEW

[chest ap]
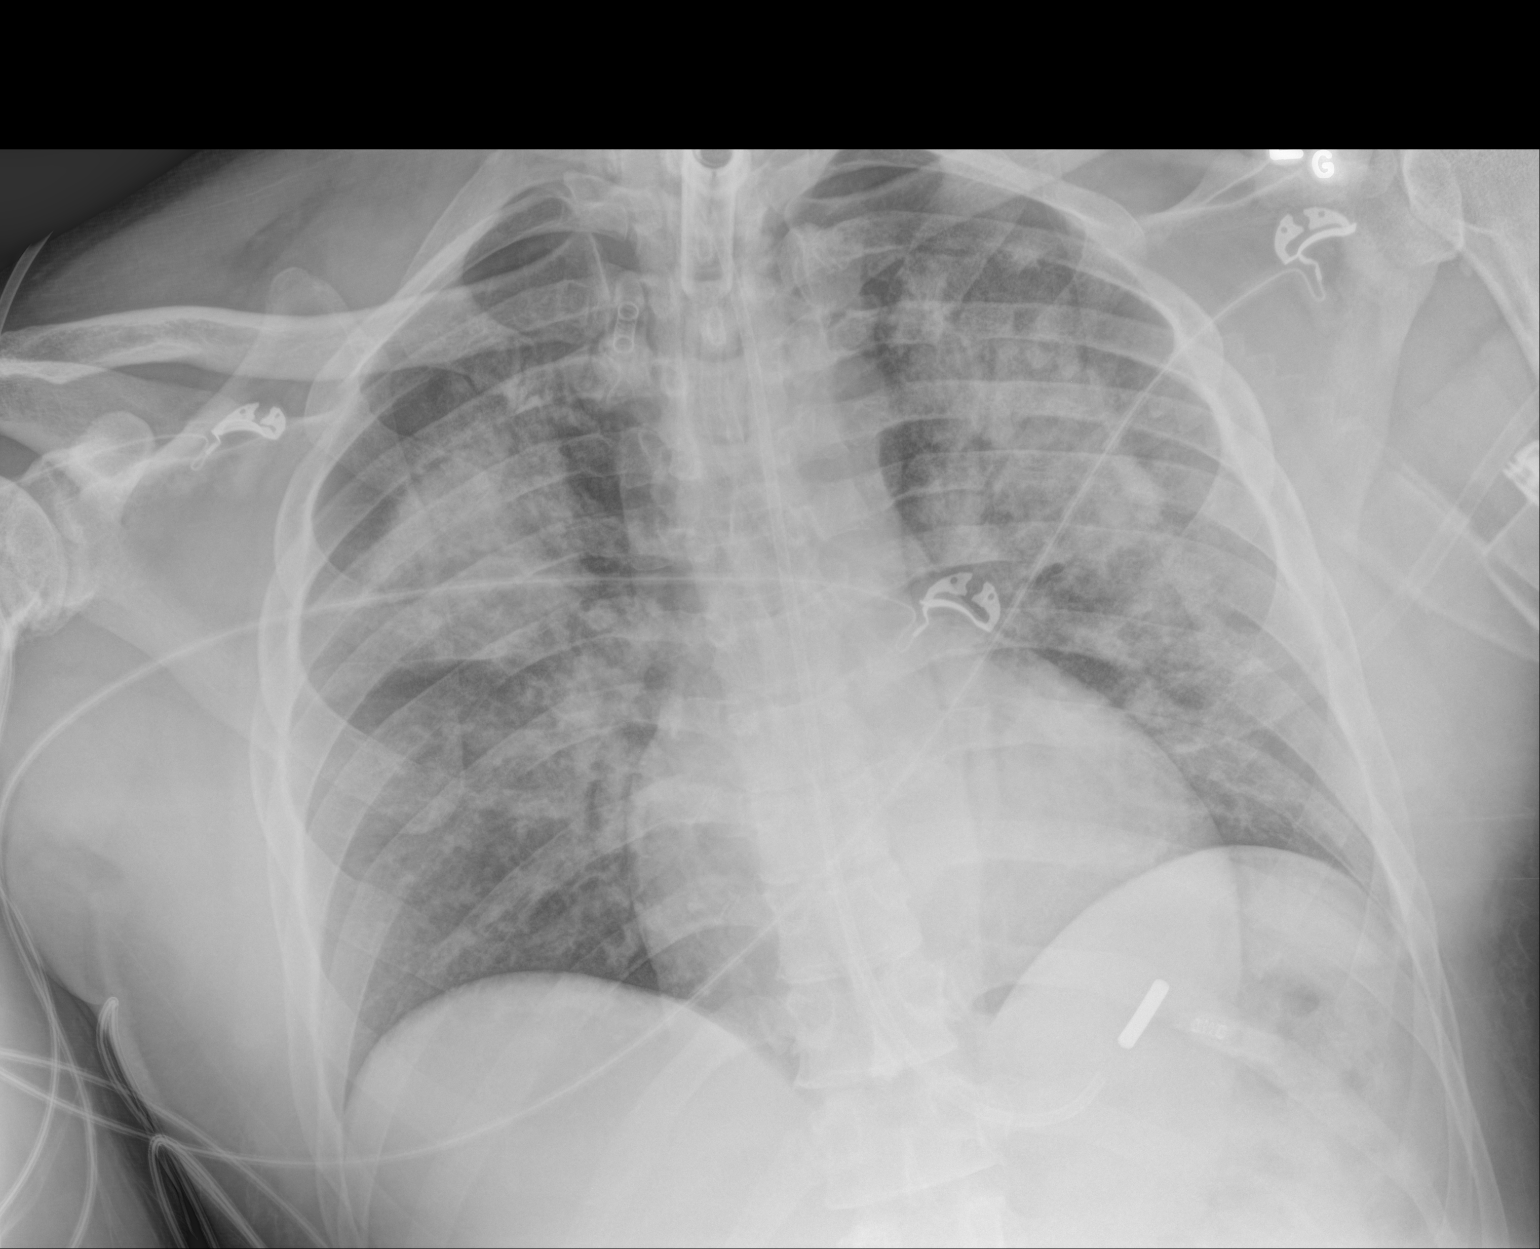

[1 of 1 positions shown; findings below may reference images not displayed]

FINDINGS: Tracheostomy tube in stable position. Interval placement of feeding
tube, is tip is projected over the stomach. Diffuse bilateral
pulmonary infiltrates are again noted. These are particularly
prominent over the upper lobes. Stable cardiomegaly. No pleural
effusion or pneumothorax.
IMPRESSION: 1. Interim placement of feeding tube, its tip is in the stomach.
Tracheostomy tube in stable position
2. Persistent bilateral pulmonary infiltrates, protrudes in the
upper lobes. No interim change.
3. Stable cardiomegaly.

## 2016-04-16 IMAGING — CR DG CHEST 1V PORT
1 series · 1 of 1 positions shown · non-contrast
Comparison: 06/26/2014

CLINICAL DATA: Pulmonary edema with increasing shortness of breath.

EXAM:
PORTABLE CHEST - 1 VIEW

[AP]
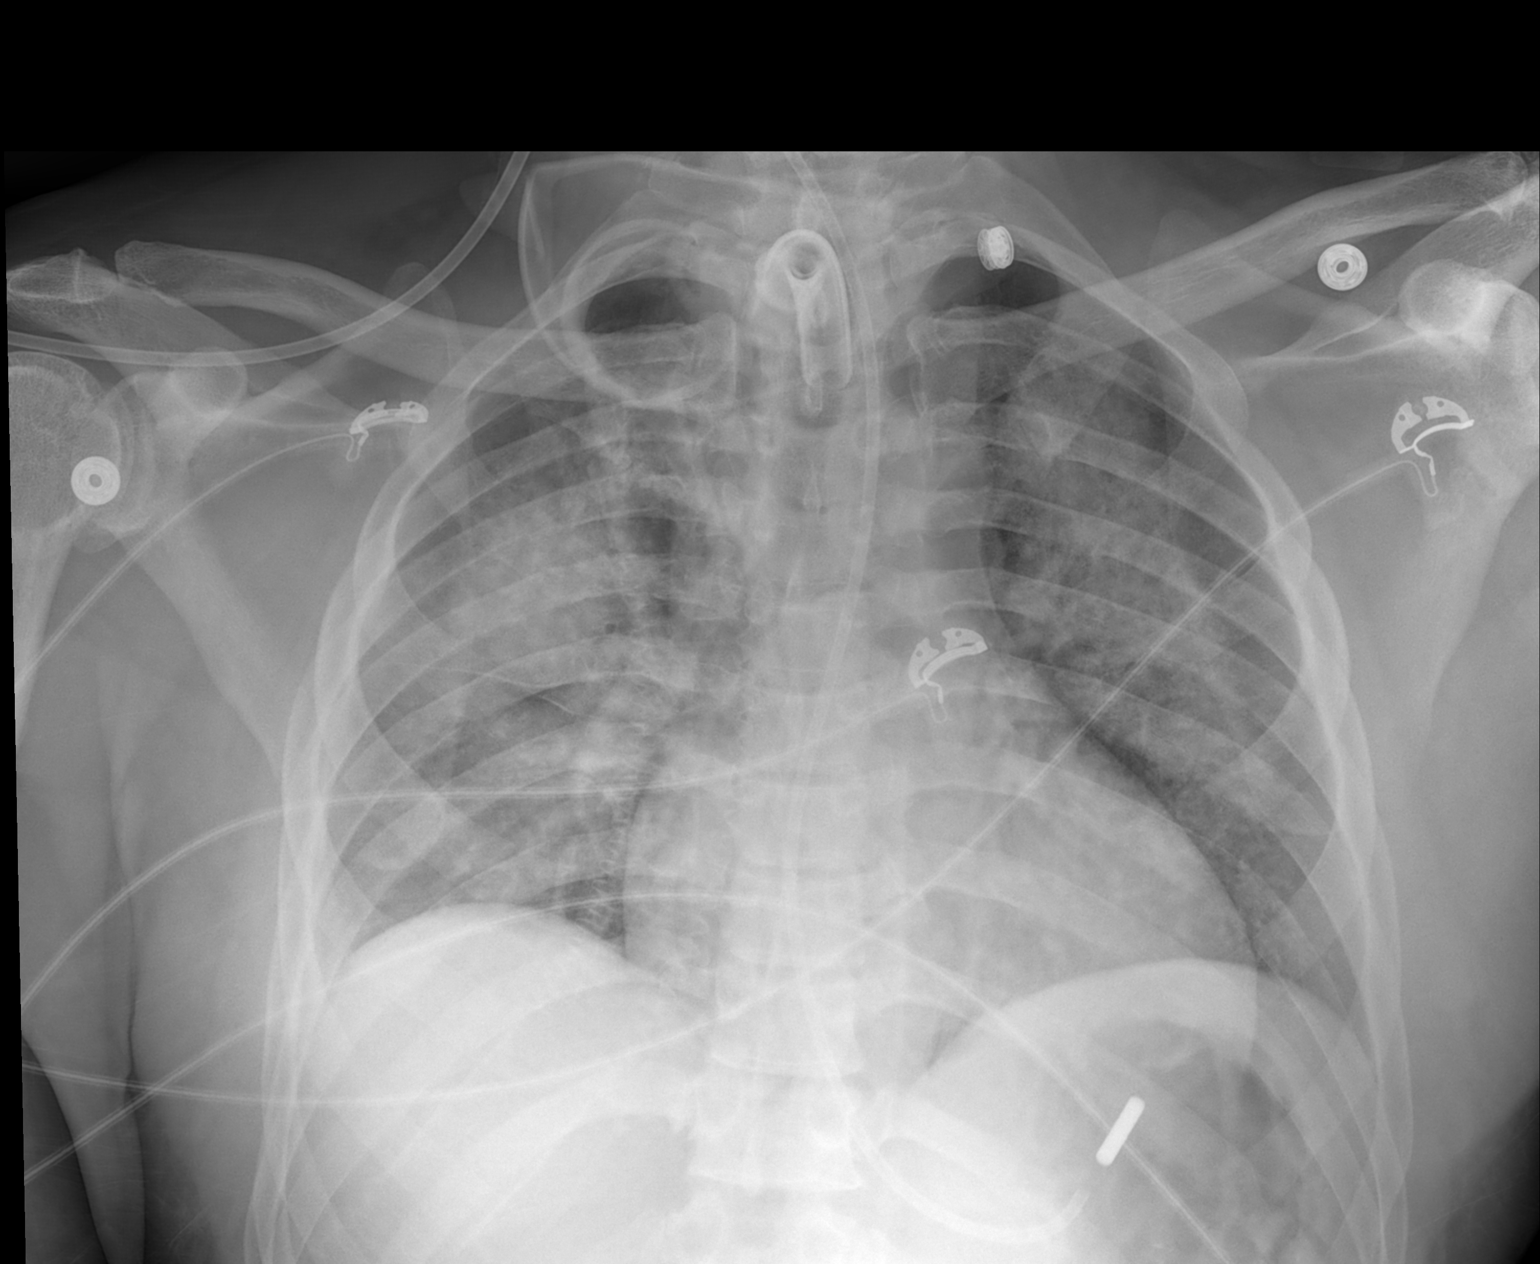

[1 of 1 positions shown; findings below may reference images not displayed]

FINDINGS: Shallow inspiration. Tracheostomy tube and feeding tube are
unchanged in position. Persistent bilateral perihilar consolidation
or edema. Increasing opacity demonstrated in the right lung base
since prior study. This suggest progression. No blunting of
costophrenic angles. No pneumothorax. No change since prior study.
IMPRESSION: Bilateral perihilar consolidation demonstrating increase in the
right lung base.

## 2017-04-25 ENCOUNTER — Encounter: Payer: Self-pay | Admitting: Gastroenterology

## 2017-10-10 ENCOUNTER — Other Ambulatory Visit: Payer: Self-pay

## 2017-10-10 ENCOUNTER — Emergency Department (HOSPITAL_COMMUNITY): Payer: BLUE CROSS/BLUE SHIELD

## 2017-10-10 ENCOUNTER — Encounter (HOSPITAL_COMMUNITY): Payer: Self-pay | Admitting: *Deleted

## 2017-10-10 ENCOUNTER — Emergency Department (HOSPITAL_COMMUNITY)
Admission: EM | Admit: 2017-10-10 | Discharge: 2017-10-11 | Disposition: A | Discharge status: Home / Self Care | Payer: BLUE CROSS/BLUE SHIELD | Attending: Emergency Medicine | Admitting: Emergency Medicine

## 2017-10-10 DIAGNOSIS — R51 Headache: Secondary | ICD-10-CM | POA: Diagnosis not present

## 2017-10-10 DIAGNOSIS — Z79899 Other long term (current) drug therapy: Secondary | ICD-10-CM | POA: Diagnosis not present

## 2017-10-10 DIAGNOSIS — E119 Type 2 diabetes mellitus without complications: Secondary | ICD-10-CM | POA: Insufficient documentation

## 2017-10-10 DIAGNOSIS — F17210 Nicotine dependence, cigarettes, uncomplicated: Secondary | ICD-10-CM | POA: Diagnosis not present

## 2017-10-10 DIAGNOSIS — I160 Hypertensive urgency: Secondary | ICD-10-CM | POA: Diagnosis not present

## 2017-10-10 DIAGNOSIS — I10 Essential (primary) hypertension: Secondary | ICD-10-CM | POA: Diagnosis present

## 2017-10-10 DIAGNOSIS — R0789 Other chest pain: Secondary | ICD-10-CM | POA: Insufficient documentation

## 2017-10-10 LAB — BASIC METABOLIC PANEL
Anion gap: 8 (ref 5–15)
BUN: 17 mg/dL (ref 6–20)
CO2: 27 mmol/L (ref 22–32)
CREATININE: 1.2 mg/dL (ref 0.61–1.24)
Calcium: 8.9 mg/dL (ref 8.9–10.3)
Chloride: 106 mmol/L (ref 98–111)
Glucose, Bld: 89 mg/dL (ref 70–99)
Potassium: 4 mmol/L (ref 3.5–5.1)
SODIUM: 141 mmol/L (ref 135–145)

## 2017-10-10 LAB — CBC
HCT: 40.4 % (ref 39.0–52.0)
Hemoglobin: 13.6 g/dL (ref 13.0–17.0)
MCH: 30.1 pg (ref 26.0–34.0)
MCHC: 33.7 g/dL (ref 30.0–36.0)
MCV: 89.4 fL (ref 78.0–100.0)
PLATELETS: 233 10*3/uL (ref 150–400)
RBC: 4.52 MIL/uL (ref 4.22–5.81)
RDW: 13.5 % (ref 11.5–15.5)
WBC: 5.1 10*3/uL (ref 4.0–10.5)

## 2017-10-10 LAB — I-STAT TROPONIN, ED: Troponin i, poc: 0.01 ng/mL (ref 0.00–0.08)

## 2017-10-10 NOTE — ED Triage Notes (Signed)
Pt states he took his blood pressure today and it was elevated 200/100's. He says he took it because he has just not been feeling right. Taking his BP meds as prescribed. Also reports some chest tightness that comes and goes associated with SOB.

## 2017-10-10 NOTE — ED Notes (Signed)
Patient transported to X-ray 

## 2017-10-11 MED ORDER — AMLODIPINE BESYLATE 10 MG PO TABS: 10.0000 mg | ORAL_TABLET | Freq: Every day | ORAL | 0 refills | Status: DC

## 2017-10-11 NOTE — Discharge Instructions (Signed)
Continue your blood pressure medication as before.  Begin taking Norvasc as prescribed this evening.  Take your blood pressures twice daily and keep a record of this to take to your next doctor's appointment.

## 2017-10-11 NOTE — ED Provider Notes (Signed)
[facility] DEPT Provider Note   CSN: [id number] Arrival date & time: 10/10/17  2132     History   Chief Complaint Chief Complaint  Patient presents with  . Hypertension    HPI Dakota Jackson is a 52 y.o. male.  Patient is a 52 year old male with history of hypertension.  He presents today with elevated blood pressure and not feeling well.  This has been ongoing for the past 2 days.  His blood pressure has run as high as 409 systolic despite taking the medication prescribed by his primary doctor.  He denies any fevers or chills.  He does report headache and occasional tightness in his chest.  The history is provided by the patient.  Hypertension  This is a new problem. The current episode started 2 days ago. The problem occurs constantly. The problem has been gradually worsening. Associated symptoms include chest pain and headaches. Nothing aggravates the symptoms. Nothing relieves the symptoms. He has tried nothing for the symptoms.    Past Medical History:  Diagnosis Date  . Arthritis    bilateral shoulders  . Diverticulosis   . Hemorrhoids   . Hypertension    no meds  . Tubular adenoma of colon 03/2012    Patient Active Problem List   Diagnosis Date Noted  . Pernicious anemia 09/10/2014  . Numbness of feet 07/08/2014  . DM type 2 (diabetes mellitus, type 2) ([place]) 07/03/2014  . 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 Routine general medical examination at a health care facility 03/19/2012  . AC (acromioclavicular) arthritis 03/19/2012    Past Surgical History:  Procedure Laterality Date  . CIRCUMCISION  04/17/2012   Procedure: CIRCUMCISION ADULT;  Surgeon: Hanley Ben, MD;  Location: [facility];  Service: Urology;  Laterality: N/A;  . TRACHEOSTOMY TUBE PLACEMENT N/A 06/25/2014   Procedure: , attempted fiveroptic intubation;  Surgeon: Leta Baptist, MD;  Location: [facility];  Service: ENT;  Laterality: N/A;        Home  Medications    Prior to Admission medications   Medication Sig Start Date End Date Taking? Authorizing Provider  albuterol (PROVENTIL HFA;VENTOLIN HFA) 108 (90 Base) MCG/ACT inhaler Inhale 1-2 puffs into the lungs every 6 (six) hours as needed for wheezing or shortness of breath.   Yes [provider]  Aspirin-Caffeine (BAYER BACK & BODY) 500-32.5 MG TABS Take 1 tablet by mouth daily as needed (pain).   Yes [provider]  cholecalciferol (VITAMIN D) 1000 units tablet Take 1,000 Units by mouth daily.   Yes [provider]  ibuprofen (ADVIL,MOTRIN) 200 MG tablet Take 800 mg by mouth every 6 (six) hours as needed for moderate pain.   Yes [provider]  losartan (COZAAR) 50 MG tablet Take 50 mg by mouth daily. 09/25/17  Yes [provider]  meloxicam (MOBIC) 15 MG tablet Take 1 tablet (15 mg total) by mouth daily. Patient not taking: Reported on 10/10/2017 09/10/14   Janith Lima, MD    Family History Family History  Problem Relation Age of Onset  . Lung cancer Mother   . Alcohol abuse Mother   . Hypertension Mother   . Cancer Father        lung  . Arthritis Neg Hx   . Diabetes Neg Hx   . Drug abuse Neg Hx   . Early death Neg Hx   . Heart disease Neg Hx   . Hyperlipidemia Neg Hx   . Stroke Neg Hx  Social History Social History   Tobacco Use  . Smoking status: Current Every Day Smoker    Packs/day: 0.50    Years: 30.00    Pack years: 15.00    Types: Cigarettes    Start date: 03/19/1982  . Smokeless tobacco: Never Used  Substance Use Topics  . Alcohol use: Yes    Alcohol/week: 6.0 oz    Types: 10 Cans of beer per week    Comment: haven't drank in 4 weeks   . Drug use: No    Comment: past drug abuse     Allergies   Patient has no known allergies.   Review of Systems Review of Systems  Cardiovascular: Positive for chest pain.  Neurological: Positive for headaches.  All other systems reviewed and are  negative.    Physical Exam Updated Vital Signs BP (!) 180/105 (BP Location: Right Arm)   Pulse 62   Temp 98.3 F (36.8 C) (Oral)   Resp 17   Ht 5\' 10"  (1.778 m)   Wt 83.5 kg (184 lb)   SpO2 100%   BMI 26.40 kg/m   Physical Exam  Constitutional: He is oriented to person, place, and time. He appears well-developed and well-nourished. No distress.  HENT:  Head: Normocephalic and atraumatic.  Mouth/Throat: Oropharynx is clear and moist.  Eyes: Pupils are equal, round, and reactive to light. EOM are normal.  Neck: Normal range of motion. Neck supple.  Cardiovascular: Normal rate and regular rhythm. Exam reveals no friction rub.  No murmur heard. Pulmonary/Chest: Effort normal and breath sounds normal. No respiratory distress. He has no wheezes. He has no rales.  Abdominal: Soft. Bowel sounds are normal. He exhibits no distension. There is no tenderness.  Musculoskeletal: Normal range of motion. He exhibits no edema.  Neurological: He is alert and oriented to person, place, and time. No cranial nerve deficit. He exhibits normal muscle tone. Coordination normal.  Skin: Skin is warm and dry. He is not diaphoretic.  Nursing note and vitals reviewed.    ED Treatments / Results  Labs (all labs ordered are listed, but only abnormal results are displayed) Labs Reviewed  BASIC METABOLIC PANEL  CBC  I-STAT TROPONIN, ED    EKG None  Radiology Dg Chest 2 View  Result Date: 10/10/2017 CLINICAL DATA:  Chest pain EXAM: CHEST - 2 VIEW COMPARISON:  06/28/2014 FINDINGS: The heart size and mediastinal contours are within normal limits. Both lungs are clear. The visualized skeletal structures are unremarkable. IMPRESSION: No active cardiopulmonary disease. Electronically Signed   By: Donavan Foil M.D.   On: 10/10/2017 22:25    Procedures Procedures (including critical care time)  Medications Ordered in ED Medications - No data to display   Initial Impression / Assessment and Plan  / ED Course  I have reviewed the triage vital signs and the nursing notes.  Pertinent labs & imaging results that were available during my care of the patient were reviewed by me and considered in my medical decision making (see chart for details).  Patient presents with elevated blood pressure and generalized malaise.  His blood pressure is now 170/100 without intervention.  I see no indication for emergent intervention at this time.  There is no evidence for endorgan damage.  I will start Norvasc and have him follow-up his blood pressures with his primary doctor.  Final Clinical Impressions(s) / ED Diagnoses   Final diagnoses:  None    ED Discharge Orders    None  Veryl Speak, MD 10/11/17 [phone]

## 2018-06-14 DIAGNOSIS — M19011 Primary osteoarthritis, right shoulder: Secondary | ICD-10-CM | POA: Insufficient documentation

## 2018-06-25 ENCOUNTER — Other Ambulatory Visit: Payer: Self-pay | Admitting: Orthopedic Surgery

## 2018-06-25 DIAGNOSIS — M19012 Primary osteoarthritis, left shoulder: Secondary | ICD-10-CM

## 2018-09-14 ENCOUNTER — Other Ambulatory Visit: Payer: BLUE CROSS/BLUE SHIELD

## 2018-11-02 ENCOUNTER — Other Ambulatory Visit: Payer: Self-pay

## 2018-11-02 DIAGNOSIS — Z20822 Contact with and (suspected) exposure to covid-19: Secondary | ICD-10-CM

## 2018-11-04 LAB — NOVEL CORONAVIRUS, NAA: SARS-CoV-2, NAA: NOT DETECTED

## 2019-02-01 ENCOUNTER — Telehealth: Payer: Self-pay

## 2019-02-01 NOTE — Telephone Encounter (Signed)
Called patient to do their pre-visit COVID screening.  Patient states that he needs to cancel appointment due to work/insurance issues. Would like to reschedule for some time in February. Prefers a Friday appointment since he is off. Let him know that Fridays are blocked right now but that I would put in a recall to get him scheduled once the schedule is open.

## 2019-02-04 ENCOUNTER — Ambulatory Visit: Payer: BC Managed Care – PPO

## 2020-04-14 ENCOUNTER — Ambulatory Visit: Payer: BC Managed Care – PPO | Admitting: Gastroenterology

## 2020-05-26 ENCOUNTER — Ambulatory Visit: Payer: BC Managed Care – PPO | Admitting: Gastroenterology

## 2020-07-09 ENCOUNTER — Ambulatory Visit (INDEPENDENT_AMBULATORY_CARE_PROVIDER_SITE_OTHER): Payer: BC Managed Care – PPO | Admitting: Gastroenterology

## 2020-07-09 ENCOUNTER — Other Ambulatory Visit: Payer: Self-pay

## 2020-07-09 ENCOUNTER — Encounter: Payer: Self-pay | Admitting: Gastroenterology

## 2020-07-09 VITALS — BP 134/90 | HR 88 | Ht 69.5 in | Wt 196.2 lb

## 2020-07-09 DIAGNOSIS — K219 Gastro-esophageal reflux disease without esophagitis: Secondary | ICD-10-CM

## 2020-07-09 DIAGNOSIS — Z86010 Personal history of colonic polyps: Secondary | ICD-10-CM | POA: Diagnosis not present

## 2020-07-09 NOTE — Progress Notes (Signed)
History of Present Illness: This is a 54 year old male referred by Pietro Cassis, PA-C for the evaluation of a personal history of adenomatous colon polyps and supected GERD with LPR.  He developed symptoms with neck pain, sore throat, throat clearing and was recently diagnosed with GERD with LPR.  He was recommended to take omeprazole 40 mg twice daily however he misunderstood the instructions and has been taking it once daily.  His neck pain resolved however his sore throat and throat clearing have improved but have not resolved. Colonoscopy in Jan 2014 with 2 small adenomatous colon polyps. Denies weight loss, abdominal pain, constipation, diarrhea, change in stool caliber, melena, hematochezia, nausea, vomiting, heartburn, dysphagia, chest pain.     No Known Allergies Outpatient Medications Prior to Visit  Medication Sig Dispense Refill  . ibuprofen (ADVIL) 200 MG tablet Take 200-600 mg by mouth as needed.    Marland Kitchen lisinopril-hydrochlorothiazide (ZESTORETIC) 10-12.5 MG tablet Take 1 tablet by mouth daily.    . mirtazapine (REMERON) 30 MG tablet Take 30 mg by mouth daily.    Marland Kitchen omeprazole (PRILOSEC) 40 MG capsule Take 1 capsule by mouth in the morning and at bedtime.    . tadalafil (CIALIS) 20 MG tablet Take 1 tablet by mouth as needed for erectile dysfunction.    . temazepam (RESTORIL) 15 MG capsule Take 15 mg by mouth daily.    . mirtazapine (REMERON) 7.5 MG tablet Take 2 tablets by mouth every evening.     No facility-administered medications prior to visit.   Past Medical History:  Diagnosis Date  . Arthritis    bilateral shoulders  . Diverticulosis   . ED (erectile dysfunction)   . Hemorrhoids   . Hypertension    no meds  . Insomnia   . Tubular adenoma of colon 03/2012   Past Surgical History:  Procedure Laterality Date  . CIRCUMCISION  04/17/2012   Procedure: CIRCUMCISION ADULT;  Surgeon: Hanley Ben, MD;  Location: [facility];  Service: Urology;   Laterality: N/A;  . TRACHEOSTOMY TUBE PLACEMENT N/A 06/25/2014   Procedure: , attempted fiveroptic intubation;  Surgeon: Leta Baptist, MD;  Location: [facility];  Service: ENT;  Laterality: N/A;   Social History   Socioeconomic History  . Marital status: Married    Spouse name: Not on file  . Number of children: 1  . Years of education: Not on file  . Highest education level: Not on file  Occupational History    Employer: [employer]  Tobacco Use  . Smoking status: Current Every Day Smoker    Packs/day: 0.50    Years: 30.00    Pack years: 15.00    Types: Cigarettes    Start date: 03/19/1982  . Smokeless tobacco: Never Used  Vaping Use  . Vaping Use: Never used  Substance and Sexual Activity  . 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 Drug use: No    Comment: past drug abuse  . Sexual activity: Yes    Birth control/protection: None  Other Topics Concern  . Not on file  Social History Narrative  . Not on file   Social Determinants of Health   Financial Resource Strain: Not on file  Food Insecurity: Not on file  Transportation Needs: Not on file  Physical Activity: Not on file  Stress: Not on file  Social Connections: Not on file   Family History  Problem Relation Age of  Onset  . Lung cancer Mother   . Alcohol abuse Mother   . Hypertension Mother   . Diabetes Mother   . Alcohol abuse Sister   . Diabetes Maternal Aunt   . Arthritis Neg Hx   . Drug abuse Neg Hx   . Early death Neg Hx   . Heart disease Neg Hx   . Hyperlipidemia Neg Hx   . Stroke Neg Hx      Review of Systems: Pertinent positive and negative review of systems were noted in the above HPI section. All other review of systems were otherwise negative.    Physical Exam: General: Well developed, well nourished, no acute distress Head: Normocephalic and atraumatic Eyes: Sclerae anicteric, EOMI Ears: Normal auditory acuity Mouth: Not examined, mask on during  Covid-19 pandemic Neck: Supple, no masses or thyromegaly Lungs: Clear throughout to auscultation Heart: Regular rate and rhythm; no murmurs, rubs or bruits Abdomen: Soft, non tender and non distended. No masses, hepatosplenomegaly or hernias noted. Normal Bowel sounds Rectal: Deferred to colonoscopy  Musculoskeletal: Symmetrical with no gross deformities  Skin: No lesions on visible extremities Pulses:  Normal pulses noted Extremities: No clubbing, cyanosis, edema or deformities noted Neurological: Alert oriented x 4, grossly nonfocal Cervical Nodes:  No significant cervical adenopathy Inguinal Nodes: No significant inguinal adenopathy Psychological:  Alert and cooperative. Normal mood and affect   Assessment and Recommendations:  1. Personal history of adenomatous colon polyps.  He is overdue for surveillance colonoscopy.  Schedule colonoscopy. The risks (including bleeding, perforation, infection, missed lesions, medication reactions and possible hospitalization or surgery if complications occur), benefits, and alternatives to colonoscopy with possible biopsy and possible polypectomy were discussed with the patient and they consent to proceed.   2. GERD with LPR.  Closely follow antireflux measures.  Discontinue cigarette smoking.  Increase omeprazole to 40 mg twice daily before breakfast and dinner.  Schedule EGD. The risks (including bleeding, perforation, infection, missed lesions, medication reactions and possible hospitalization or surgery if complications occur), benefits, and alternatives to endoscopy with possible biopsy and possible dilation were discussed with the patient and they consent to proceed.  Maintain follow-up with ENT as recommended.    cc: Pietro Cassis, PA-C

## 2020-07-09 NOTE — Patient Instructions (Signed)
Increase your omeprazole 20 mg tablets to twice daily dosing.   Patient advised to avoid spicy, acidic, citrus, chocolate, mints, fruit and fruit juices.  Limit the intake of caffeine, alcohol and Soda.  Don't exercise too soon after eating.  Don't lie down within 3-4 hours of eating.  Elevate the head of your bed.  You have been scheduled for a colonoscopy. Please follow written instructions given to you at your visit today.  Please pick up your prep supplies at the pharmacy within the next 1-3 days. If you use inhalers (even only as needed), please bring them with you on the day of your procedure.  Normal BMI (Body Mass Index- based on height and weight) is between 19 and 25. Your BMI today is Body mass index is 28.57 kg/m. Marland Kitchen Please consider follow up  regarding your BMI with your Primary Care Provider.  Thank you for choosing me and [facility].  Pricilla Riffle. Dagoberto Ligas., MD., [facility] Regal

## 2020-07-10 ENCOUNTER — Other Ambulatory Visit: Payer: Self-pay

## 2020-07-10 ENCOUNTER — Encounter: Payer: Self-pay | Admitting: Gastroenterology

## 2020-07-10 ENCOUNTER — Ambulatory Visit (AMBULATORY_SURGERY_CENTER): Payer: BC Managed Care – PPO | Admitting: Gastroenterology

## 2020-07-10 VITALS — BP 120/72 | HR 75 | Temp 98.0°F | Resp 13 | Ht 69.0 in | Wt 196.0 lb

## 2020-07-10 DIAGNOSIS — D120 Benign neoplasm of cecum: Secondary | ICD-10-CM | POA: Diagnosis not present

## 2020-07-10 DIAGNOSIS — Z86010 Personal history of colonic polyps: Secondary | ICD-10-CM | POA: Diagnosis not present

## 2020-07-10 DIAGNOSIS — D124 Benign neoplasm of descending colon: Secondary | ICD-10-CM

## 2020-07-10 DIAGNOSIS — K219 Gastro-esophageal reflux disease without esophagitis: Secondary | ICD-10-CM | POA: Diagnosis present

## 2020-07-10 DIAGNOSIS — D122 Benign neoplasm of ascending colon: Secondary | ICD-10-CM

## 2020-07-10 MED ORDER — SODIUM CHLORIDE 0.9 % IV SOLN: 500.0000 mL | Freq: Once | INTRAVENOUS | Status: DC

## 2020-07-10 NOTE — Progress Notes (Signed)
Called to room to assist during endoscopic procedure.  Patient ID and intended procedure confirmed with present staff. Received instructions for my participation in the procedure from the performing physician.  

## 2020-07-10 NOTE — Progress Notes (Signed)
To PACU, VSS. Report to [email or web address] 

## 2020-07-10 NOTE — Addendum Note (Signed)
Addended by: Ernestine Conrad D on: 07/10/2020 04:30 PM   Modules accepted: Orders

## 2020-07-10 NOTE — Op Note (Signed)
[facility] Patient Name: Dakota Jackson Procedure Date: 07/10/2020 2:04 PM MRN: [id number] Endoscopist: Ladene Artist , MD Age: 55 Referring MD:  Date of Birth: 03/17/1966 Gender: Male Account #: [id number] Procedure:                Colonoscopy Indications:              Surveillance: Personal history of adenomatous                            polyps on last colonoscopy > 5 years ago Medicines:                Monitored Anesthesia Care Procedure:                Pre-Anesthesia Assessment:                           - Prior to the procedure, a History and Physical                            was performed, and patient medications and                            allergies were reviewed. The patient's tolerance of                            previous anesthesia was also reviewed. The risks                            and benefits of the procedure and the sedation                            options and risks were discussed with the patient.                            All questions were answered, and informed consent                            was obtained. Prior Anticoagulants: The patient has                            taken no previous anticoagulant or antiplatelet                            agents. ASA Grade Assessment: II - A patient with                            mild systemic disease. After reviewing the risks                            and benefits, the patient was deemed in                            satisfactory condition to undergo the procedure.  After obtaining informed consent, the colonoscope                            was passed under direct vision. Throughout the                            procedure, the patient's blood pressure, pulse, and                            oxygen saturations were monitored continuously. The                            Olympus CF-HQ190 [id number]) Colonoscope was                            introduced through the anus  and advanced to the the                            cecum, identified by appendiceal orifice and                            ileocecal valve. The ileocecal valve, appendiceal                            orifice, and rectum were photographed. The quality                            of the bowel preparation was excellent. The                            colonoscopy was performed without difficulty. The                            patient tolerated the procedure well. Scope In: 2:09:05 PM Scope Out: 2:24:14 PM Scope Withdrawal Time: 0 hours 13 minutes 19 seconds  Total Procedure Duration: 0 hours 15 minutes 9 seconds  Findings:                 The perianal and digital rectal examinations were                            normal.                           A 5 mm polyp was found in the ileocecal valve. The                            polyp was sessile. The polyp was removed with a                            cold biopsy forceps. Resection and retrieval were                            complete.  Five sessile polyps were found in the descending                            colon (2) and cecum (3). The polyps were 6 to 7 mm                            in size. These polyps were removed with a cold                            snare. Resection and retrieval were complete.                           A few medium-mouthed diverticula were found in the                            right colon. There was no evidence of diverticular                            bleeding.                           Internal hemorrhoids were found during                            retroflexion. The hemorrhoids were small and Grade                            I (internal hemorrhoids that do not prolapse).                           The exam was otherwise without abnormality on                            direct and retroflexion views. Complications:            No immediate complications. Estimated blood loss:                             None. Estimated Blood Loss:     Estimated blood loss: none. Impression:               - One 5 mm polyp at the ileocecal valve, removed                            with a cold biopsy forceps. Resected and retrieved.                           - Five 6 to 7 mm polyps in the descending colon and                            in the cecum, removed with a cold snare. Resected                            and retrieved.                           -  Mild right colon diverticulosis.                           - Internal hemorrhoids.                           - The examination was otherwise normal on direct                            and retroflexion views. Recommendation:           - Repeat colonoscopy date to be determined after                            pending pathology results are reviewed for                            surveillance based on pathology results.                           - Patient has a contact number available for                            emergencies. The signs and symptoms of potential                            delayed complications were discussed with the                            patient. Return to normal activities tomorrow.                            Written discharge instructions were provided to the                            patient.                           - Resume previous diet.                           - Continue present medications.                           - Await pathology results. Ladene Artist, MD 07/10/2020 2:28:22 PM This report has been signed electronically.

## 2020-07-10 NOTE — Patient Instructions (Signed)
Discharge instructions given. Handouts on polyps,diverticulosis and hemorrhoids. Resume previous medications. YOU HAD AN ENDOSCOPIC PROCEDURE TODAY AT [facility]:   Refer to the procedure report that was given to you for any specific questions about what was found during the examination.  If the procedure report does not answer your questions, please call your gastroenterologist to clarify.  If you requested that your care partner not be given the details of your procedure findings, then the procedure report has been included in a sealed envelope for you to review at your convenience later.  YOU SHOULD EXPECT: Some feelings of bloating in the abdomen. Passage of more gas than usual.  Walking can help get rid of the air that was put into your GI tract during the procedure and reduce the bloating. If you had a lower endoscopy (such as a colonoscopy or flexible sigmoidoscopy) you may notice spotting of blood in your stool or on the toilet paper. If you underwent a bowel prep for your procedure, you may not have a normal bowel movement for a few days.  Please Note:  You might notice some irritation and congestion in your nose or some drainage.  This is from the oxygen used during your procedure.  There is no need for concern and it should clear up in a day or so.  SYMPTOMS TO REPORT IMMEDIATELY:   Following lower endoscopy (colonoscopy or flexible sigmoidoscopy):  Excessive amounts of blood in the stool  Significant tenderness or worsening of abdominal pains  Swelling of the abdomen that is new, acute  Fever of 100F or higher   Following upper endoscopy (EGD)  Vomiting of blood or coffee ground material  New chest pain or pain under the shoulder blades  Painful or persistently difficult swallowing  New shortness of breath  Fever of 100F or higher  Black, tarry-looking stools  For urgent or emergent issues, a gastroenterologist can be reached at any hour by calling [phone]  [phone]. Do not use MyChart messaging for urgent concerns.    DIET:  We do recommend a small meal at first, but then you may proceed to your regular diet.  Drink plenty of fluids but you should avoid alcoholic beverages for 24 hours.  ACTIVITY:  You should plan to take it easy for the rest of today and you should NOT DRIVE or use heavy machinery until tomorrow (because of the sedation medicines used during the test).    FOLLOW UP: Our staff will call the number listed on your records 48-72 hours following your procedure to check on you and address any questions or concerns that you may have regarding the information given to you following your procedure. If we do not reach you, we will leave a message.  We will attempt to reach you two times.  During this call, we will ask if you have developed any symptoms of COVID 19. If you develop any symptoms (ie: fever, flu-like symptoms, shortness of breath, cough etc.) before then, please call [phone].  If you test positive for Covid 19 in the 2 weeks post procedure, please call and report this information to [place].    If any biopsies were taken you will be contacted by phone or by letter within the next 1-3 weeks.  Please call us at [phone] if you have not heard about the biopsies in 3 weeks.    SIGNATURES/CONFIDENTIALITY: You and/or your care partner have signed paperwork which will be entered into your electronic medical record.  These  signatures attest to the fact that that the information above on your After Visit Summary has been reviewed and is understood.  Full responsibility of the confidentiality of this discharge information lies with you and/or your care-partner. 

## 2020-07-10 NOTE — Op Note (Signed)
[facility] Patient Name: Dakota Jackson Procedure Date: 07/10/2020 2:00 PM MRN: [id number] Endoscopist: Ladene Artist , MD Age: 55 Referring MD:  Date of Birth: 08-05-1965 Gender: Male Account #: [id number] Procedure:                Upper GI endoscopy Indications:              Gastroesophageal reflux disease Medicines:                Monitored Anesthesia Care Procedure:                Pre-Anesthesia Assessment:                           - Prior to the procedure, a History and Physical                            was performed, and patient medications and                            allergies were reviewed. The patient's tolerance of                            previous anesthesia was also reviewed. The risks                            and benefits of the procedure and the sedation                            options and risks were discussed with the patient.                            All questions were answered, and informed consent                            was obtained. Prior Anticoagulants: The patient has                            taken no previous anticoagulant or antiplatelet                            agents. ASA Grade Assessment: II - A patient with                            mild systemic disease. After reviewing the risks                            and benefits, the patient was deemed in                            satisfactory condition to undergo the procedure.                           After obtaining informed consent, the endoscope was  passed under direct vision. Throughout the                            procedure, the patient's blood pressure, pulse, and                            oxygen saturations were monitored continuously. The                            Endoscope was introduced through the mouth, and                            advanced to the second part of duodenum. The upper                            GI endoscopy was  accomplished without difficulty.                            The patient tolerated the procedure well. Scope In: Scope Out: Findings:                 The esophagus was normal.                           The stomach was normal.                           The examined duodenum was normal.                           The cardia and gastric fundus were normal on                            retroflexion. Complications:            No immediate complications. Estimated Blood Loss:     Estimated blood loss: none. Impression:               - Normal esophagus.                           - Normal stomach.                           - Normal examined duodenum.                           - No specimens collected. Recommendation:           - Patient has a contact number available for                            emergencies. The signs and symptoms of potential                            delayed complications were discussed with the  patient. Return to normal activities tomorrow.                            Written discharge instructions were provided to the                            patient.                           - Resume previous diet.                           - Follow antireflux measures.                           - Continue present medications. Ladene Artist, MD 07/10/2020 2:37:00 PM This report has been signed electronically.

## 2020-07-10 NOTE — Progress Notes (Signed)
Pt's states no medical or surgical changes since previsit or office visit. 

## 2020-07-14 ENCOUNTER — Telehealth: Payer: Self-pay | Admitting: *Deleted

## 2020-07-14 NOTE — Telephone Encounter (Signed)
Follow up call made. 

## 2020-07-14 NOTE — Telephone Encounter (Signed)
  Follow up Call-  Call back number 07/10/2020  Post procedure Call Back phone  # [phone]  Permission to leave phone message Yes  Some recent data might be hidden     No answer at 2nd attempt follow up phone call.  Left message on voicemail.

## 2020-07-20 ENCOUNTER — Encounter: Payer: Self-pay | Admitting: Gastroenterology

## 2021-11-07 ENCOUNTER — Encounter (HOSPITAL_COMMUNITY): Payer: Self-pay

## 2021-11-07 ENCOUNTER — Other Ambulatory Visit: Payer: Self-pay

## 2021-11-07 ENCOUNTER — Emergency Department (HOSPITAL_COMMUNITY)
Admission: EM | Admit: 2021-11-07 | Discharge: 2021-11-07 | Disposition: A | Discharge status: Home / Self Care | Payer: BC Managed Care – PPO | Attending: Emergency Medicine | Admitting: Emergency Medicine

## 2021-11-07 DIAGNOSIS — X58XXXA Exposure to other specified factors, initial encounter: Secondary | ICD-10-CM | POA: Insufficient documentation

## 2021-11-07 DIAGNOSIS — S39012A Strain of muscle, fascia and tendon of lower back, initial encounter: Secondary | ICD-10-CM | POA: Insufficient documentation

## 2021-11-07 DIAGNOSIS — S3992XA Unspecified injury of lower back, initial encounter: Secondary | ICD-10-CM | POA: Diagnosis present

## 2021-11-07 DIAGNOSIS — T148XXA Other injury of unspecified body region, initial encounter: Secondary | ICD-10-CM

## 2021-11-07 MED ORDER — CYCLOBENZAPRINE HCL 10 MG PO TABS: 10.0000 mg | ORAL_TABLET | Freq: Two times a day (BID) | ORAL | 0 refills | Status: AC | PRN

## 2021-11-07 MED ORDER — DICLOFENAC SODIUM 1 % EX GEL: 2.0000 g | Freq: Four times a day (QID) | CUTANEOUS | 0 refills | Status: AC

## 2021-11-07 MED ORDER — IBUPROFEN 600 MG PO TABS: 600.0000 mg | ORAL_TABLET | Freq: Four times a day (QID) | ORAL | 0 refills | Status: AC | PRN

## 2021-11-07 NOTE — ED Triage Notes (Signed)
Pt c/o lower back pain for about 3 weeks ago. Pt states he injured it when he was doing a pull up.

## 2021-11-07 NOTE — ED Provider Notes (Signed)
[facility] DEPT Provider Note   CSN: [id number] Arrival date & time: 11/07/21  1530     History  Chief Complaint  Patient presents with   Back Pain    Dakota Jackson is a 56 y.o. male.  The history is provided by the patient and medical records. No language interpreter was used.  Back Pain    56 year old male significant history of diabetes, hypertension, presenting complaining of back pain.  Patient report 3 weeks ago he was doing a pull-up when he felt a pop followed with pain to his left mid back.  Since then pain has been waxing and waning but not fully resolved.  Pain is nonradiating and happen sporadically.  Pain is moderate in severity.  No fever no abdominal pain no trouble urinating no hematuria no numbness or weakness and no rash.  He tries taking Tylenol and over-the-counter medication at home without relief.  Home Medications Prior to Admission medications   Medication Sig Start Date End Date Taking? Authorizing Provider  ibuprofen (ADVIL) 200 MG tablet Take 200-600 mg by mouth as needed.    [provider]  lisinopril-hydrochlorothiazide (ZESTORETIC) 10-12.5 MG tablet Take 1 tablet by mouth daily. 01/21/19   [provider]  mirtazapine (REMERON) 30 MG tablet Take 30 mg by mouth daily. 05/26/20   [provider]  omeprazole (PRILOSEC) 40 MG capsule Take 1 capsule by mouth in the morning and at bedtime. 06/26/20   [provider]  tadalafil (CIALIS) 20 MG tablet Take 1 tablet by mouth as needed for erectile dysfunction. 01/18/19   [provider]  temazepam (RESTORIL) 15 MG capsule Take 15 mg by mouth daily. 06/07/20   [provider]      Allergies    Patient has no known allergies.    Review of Systems   Review of Systems  Musculoskeletal:  Positive for back pain.  All other systems reviewed and are negative.   Physical Exam Updated Vital Signs BP 124/73   Pulse 67   Temp  98.7 F (37.1 C)   Resp 17   Ht '5\' 9"'$  (1.753 m)   SpO2 99%   BMI 28.94 kg/m  Physical Exam Vitals and nursing note reviewed.  Constitutional:      General: He is not in acute distress.    Appearance: He is well-developed.  HENT:     Head: Atraumatic.  Eyes:     Conjunctiva/sclera: Conjunctivae normal.  Cardiovascular:     Rate and Rhythm: Normal rate and regular rhythm.     Pulses: Normal pulses.     Heart sounds: Normal heart sounds.  Pulmonary:     Effort: Pulmonary effort is normal.     Breath sounds: Normal breath sounds.  Abdominal:     Palpations: Abdomen is soft.     Tenderness: There is no abdominal tenderness. There is no right CVA tenderness or left CVA tenderness.  Musculoskeletal:        General: Tenderness (Tenderness to left thoracic paraspinal muscle on palpation without any bruising swelling or overlying skin changes.  No significant midline spine tenderness.) present.     Cervical back: Neck supple.  Skin:    Findings: No rash.  Neurological:     Mental Status: He is alert.     ED Results / Procedures / Treatments   Labs (all labs ordered are listed, but only abnormal results are displayed) Labs Reviewed - No data to display  EKG None  Radiology No  results found.  Procedures Procedures    Medications Ordered in ED Medications - No data to display  ED Course/ Medical Decision Making/ A&P                           Medical Decision Making  BP 124/73   Pulse 67   Temp 98.7 F (37.1 C)   Resp 17   Ht '5\' 9"'$  (1.753 m)   SpO2 99%   BMI 28.94 kg/m   47:38 PM 56 year old male presenting complaining of pain to his left mid back/flank region.  Pain started while he was doing a pull-up.  States he felt a pop and since then pain has been waxing and waning but not fully resolved despite using medication at home.  He does not endorse any fever abdominal pain dysuria hematuria rash numbness or weakness.  He does not endorse any pain directly on his  spine.  On exam he does have some tenderness along his left thoracic paraspinal muscle without any bruising or deformity noted.  Abdomen is soft nontender.  Lungs clear on auscultation.  No overlying skin changes.  I suspect this is likely to be a muscle tear/strain.  I have low suspicion for kidney stones, colitis, pancreatitis, spinal fracture, or other acute emergent medical condition.  Doubt shingles.  I will provide symptomatic treatment, RICE therapy discussed.  Orthopedic referral given as needed.  Work note provided as well.  I reviewed patient's vital sign and it is reassuring.  I have reviewed patient's prior EMR including labs and imaging and considered my plan of care.  I have considered patient social determinant of health including tobacco use and recommend tobacco cessation.        Final Clinical Impression(s) / ED Diagnoses Final diagnoses:  Muscle strain    Rx / DC Orders ED Discharge Orders          Ordered    ibuprofen (ADVIL) 600 MG tablet  Every 6 hours PRN        11/07/21 1635    cyclobenzaprine (FLEXERIL) 10 MG tablet  2 times daily PRN        11/07/21 1635    diclofenac Sodium (VOLTAREN) 1 % GEL  4 times daily        11/07/21 1635              Domenic Moras, PA-C 11/07/21 1635    Drenda Freeze, MD 11/07/21 2238

## 2021-11-07 NOTE — Discharge Instructions (Addendum)
Your pain is likely due to a muscle strain or muscle tear.  You may try heat/ice as needed for comfort.  You may apply Voltaren gel to the affected area to help with your pain.  Take muscle relaxant and anti-inflammatory occasion for symptom control.  If no improvement, follow-up with orthopedist doctor for further care.

## 2021-11-15 ENCOUNTER — Other Ambulatory Visit: Payer: Self-pay | Admitting: Family Medicine

## 2021-11-15 DIAGNOSIS — F17210 Nicotine dependence, cigarettes, uncomplicated: Secondary | ICD-10-CM

## 2021-11-19 ENCOUNTER — Ambulatory Visit
Admission: RE | Admit: 2021-11-19 | Discharge: 2021-11-19 | Disposition: A | Discharge status: Home / Self Care | Payer: BC Managed Care – PPO | Source: Ambulatory Visit | Attending: Family Medicine | Admitting: Family Medicine

## 2021-11-19 DIAGNOSIS — F17210 Nicotine dependence, cigarettes, uncomplicated: Secondary | ICD-10-CM

## 2021-12-08 ENCOUNTER — Inpatient Hospital Stay: Admission: RE | Admit: 2021-12-08 | Payer: BC Managed Care – PPO | Source: Ambulatory Visit

## 2023-04-03 ENCOUNTER — Other Ambulatory Visit: Payer: Self-pay | Admitting: Physician Assistant

## 2023-04-03 DIAGNOSIS — J432 Centrilobular emphysema: Secondary | ICD-10-CM

## 2023-06-19 ENCOUNTER — Ambulatory Visit: Payer: BC Managed Care – PPO | Admitting: Neurology
